# Patient Record
Sex: Female | Born: 1985 | Race: Black or African American | Hispanic: No | Marital: Married | Smoking: Never smoker
Health system: Southern US, Community
[De-identification: ages and names within clinical notes are randomized; demographics above are authoritative.]

## PROBLEM LIST (undated history)

## (undated) ENCOUNTER — Inpatient Hospital Stay (HOSPITAL_COMMUNITY): Payer: Self-pay

## (undated) DIAGNOSIS — Z789 Other specified health status: Secondary | ICD-10-CM

## (undated) DIAGNOSIS — O30009 Twin pregnancy, unspecified number of placenta and unspecified number of amniotic sacs, unspecified trimester: Secondary | ICD-10-CM

## (undated) DIAGNOSIS — D649 Anemia, unspecified: Secondary | ICD-10-CM

## (undated) DIAGNOSIS — O133 Gestational [pregnancy-induced] hypertension without significant proteinuria, third trimester: Secondary | ICD-10-CM

## (undated) HISTORY — PX: NO PAST SURGERIES: SHX2092

---

## 2010-01-12 ENCOUNTER — Emergency Department (HOSPITAL_COMMUNITY): Admission: EM | Admit: 2010-01-12 | Discharge: 2010-01-12 | Payer: Self-pay | Admitting: Emergency Medicine

## 2010-02-27 ENCOUNTER — Emergency Department (HOSPITAL_COMMUNITY): Admission: EM | Admit: 2010-02-27 | Discharge: 2010-02-27 | Payer: Self-pay | Admitting: Emergency Medicine

## 2010-03-06 ENCOUNTER — Ambulatory Visit (HOSPITAL_COMMUNITY): Admission: AD | Admit: 2010-03-06 | Discharge: 2010-03-06 | Payer: Self-pay | Admitting: Obstetrics and Gynecology

## 2010-03-06 ENCOUNTER — Ambulatory Visit: Payer: Self-pay | Admitting: Diagnostic Radiology

## 2010-03-06 ENCOUNTER — Emergency Department (HOSPITAL_BASED_OUTPATIENT_CLINIC_OR_DEPARTMENT_OTHER): Admission: EM | Admit: 2010-03-06 | Discharge: 2010-03-06 | Payer: Self-pay | Admitting: Emergency Medicine

## 2010-03-06 ENCOUNTER — Ambulatory Visit: Payer: Self-pay | Admitting: Obstetrics & Gynecology

## 2010-09-26 LAB — DIFFERENTIAL
Basophils Absolute: 0 10*3/uL (ref 0.0–0.1)
Basophils Relative: 1 % (ref 0–1)
Eosinophils Absolute: 0.1 10*3/uL (ref 0.0–0.7)
Eosinophils Relative: 1 % (ref 0–5)
Lymphocytes Relative: 36 % (ref 12–46)
Lymphs Abs: 1.5 10*3/uL (ref 0.7–4.0)
Lymphs Abs: 2.2 10*3/uL (ref 0.7–4.0)
Monocytes Absolute: 0.3 10*3/uL (ref 0.1–1.0)
Monocytes Absolute: 0.3 10*3/uL (ref 0.1–1.0)
Monocytes Relative: 7 % (ref 3–12)
Neutro Abs: 2.3 10*3/uL (ref 1.7–7.7)
Neutro Abs: 1.6 10*3/uL — ABNORMAL LOW (ref 1.7–7.7)

## 2010-09-26 LAB — CBC
Hemoglobin: 11.6 g/dL — ABNORMAL LOW (ref 12.0–15.0)
MCV: 95.6 fL (ref 78.0–100.0)
MCV: 97.9 fL (ref 78.0–100.0)
RBC: 3.65 MIL/uL — ABNORMAL LOW (ref 3.87–5.11)
WBC: 4.1 10*3/uL (ref 4.0–10.5)

## 2010-09-26 LAB — POCT I-STAT, CHEM 8
BUN: 8 mg/dL (ref 6–23)
Creatinine, Ser: 0.7 mg/dL (ref 0.4–1.2)
HCT: 39 % (ref 36.0–46.0)
Hemoglobin: 13.3 g/dL (ref 12.0–15.0)
Sodium: 137 mEq/L (ref 135–145)
TCO2: 22 mmol/L (ref 0–100)

## 2010-09-26 LAB — BASIC METABOLIC PANEL
BUN: 8 mg/dL (ref 6–23)
CO2: 25 mEq/L (ref 19–32)
Calcium: 9.1 mg/dL (ref 8.4–10.5)
Creatinine, Ser: 0.8 mg/dL (ref 0.4–1.2)
Glucose, Bld: 93 mg/dL (ref 70–99)
Potassium: 3.7 mEq/L (ref 3.5–5.1)
Sodium: 139 mEq/L (ref 135–145)

## 2010-09-26 LAB — URINALYSIS, ROUTINE W REFLEX MICROSCOPIC
Bilirubin Urine: NEGATIVE
Hgb urine dipstick: NEGATIVE
Ketones, ur: NEGATIVE mg/dL
Protein, ur: NEGATIVE mg/dL
Specific Gravity, Urine: 1.031 — ABNORMAL HIGH (ref 1.005–1.030)
Specific Gravity, Urine: 1.027 (ref 1.005–1.030)
pH: 6 (ref 5.0–8.0)
pH: 6 (ref 5.0–8.0)

## 2010-09-26 LAB — WET PREP, GENITAL
Trich, Wet Prep: NONE SEEN
Trich, Wet Prep: NONE SEEN
Yeast Wet Prep HPF POC: NONE SEEN
Yeast Wet Prep HPF POC: NONE SEEN

## 2010-09-26 LAB — HCG, QUANTITATIVE, PREGNANCY: hCG, Beta Chain, Quant, S: 76924 m[IU]/mL — ABNORMAL HIGH (ref <5)

## 2010-09-26 LAB — RPR: RPR Ser Ql: NONREACTIVE

## 2010-09-26 LAB — GC/CHLAMYDIA PROBE AMP, GENITAL
Chlamydia, DNA Probe: NEGATIVE
GC Probe Amp, Genital: NEGATIVE

## 2010-09-26 LAB — URINE MICROSCOPIC-ADD ON

## 2010-10-02 ENCOUNTER — Emergency Department (HOSPITAL_COMMUNITY)
Admission: EM | Admit: 2010-10-02 | Discharge: 2010-10-02 | Disposition: A | Discharge status: Home / Self Care | Payer: Self-pay | Attending: Emergency Medicine | Admitting: Emergency Medicine

## 2010-10-02 DIAGNOSIS — R509 Fever, unspecified: Secondary | ICD-10-CM | POA: Insufficient documentation

## 2010-10-02 DIAGNOSIS — R112 Nausea with vomiting, unspecified: Secondary | ICD-10-CM | POA: Insufficient documentation

## 2010-10-02 DIAGNOSIS — N751 Abscess of Bartholin's gland: Secondary | ICD-10-CM | POA: Insufficient documentation

## 2010-10-02 LAB — URINALYSIS, ROUTINE W REFLEX MICROSCOPIC
Glucose, UA: NEGATIVE mg/dL
Protein, ur: NEGATIVE mg/dL
Specific Gravity, Urine: 1.018 (ref 1.005–1.030)

## 2010-12-12 ENCOUNTER — Inpatient Hospital Stay (HOSPITAL_COMMUNITY)
Admission: AD | Admit: 2010-12-12 | Discharge: 2010-12-13 | Disposition: A | Discharge status: Home / Self Care | Payer: Self-pay | Source: Ambulatory Visit | Attending: Obstetrics & Gynecology | Admitting: Obstetrics & Gynecology

## 2010-12-12 DIAGNOSIS — O200 Threatened abortion: Secondary | ICD-10-CM | POA: Insufficient documentation

## 2010-12-12 LAB — URINALYSIS, ROUTINE W REFLEX MICROSCOPIC
Bilirubin Urine: NEGATIVE
Glucose, UA: NEGATIVE mg/dL
Hgb urine dipstick: NEGATIVE
Ketones, ur: NEGATIVE mg/dL
Protein, ur: NEGATIVE mg/dL
Urobilinogen, UA: 1 mg/dL (ref 0.0–1.0)

## 2010-12-12 LAB — WET PREP, GENITAL: Yeast Wet Prep HPF POC: NONE SEEN

## 2010-12-13 ENCOUNTER — Inpatient Hospital Stay (HOSPITAL_COMMUNITY): Payer: Self-pay

## 2010-12-13 LAB — ABO/RH: ABO/RH(D): O POS

## 2010-12-13 LAB — GC/CHLAMYDIA PROBE AMP, GENITAL: Chlamydia, DNA Probe: NEGATIVE

## 2010-12-13 LAB — HCG, QUANTITATIVE, PREGNANCY: hCG, Beta Chain, Quant, S: 222 m[IU]/mL — ABNORMAL HIGH (ref <5)

## 2010-12-14 ENCOUNTER — Inpatient Hospital Stay (HOSPITAL_COMMUNITY)
Admission: AD | Admit: 2010-12-14 | Discharge: 2010-12-15 | Disposition: A | Discharge status: Home / Self Care | Payer: Self-pay | Source: Ambulatory Visit | Attending: Obstetrics and Gynecology | Admitting: Obstetrics and Gynecology

## 2010-12-14 DIAGNOSIS — O209 Hemorrhage in early pregnancy, unspecified: Secondary | ICD-10-CM | POA: Insufficient documentation

## 2010-12-15 ENCOUNTER — Other Ambulatory Visit: Payer: Self-pay | Admitting: Obstetrics and Gynecology

## 2010-12-15 DIAGNOSIS — O209 Hemorrhage in early pregnancy, unspecified: Secondary | ICD-10-CM

## 2010-12-15 LAB — HCG, QUANTITATIVE, PREGNANCY: hCG, Beta Chain, Quant, S: 674 m[IU]/mL — ABNORMAL HIGH (ref <5)

## 2010-12-19 ENCOUNTER — Inpatient Hospital Stay (HOSPITAL_COMMUNITY)
Admission: AD | Admit: 2010-12-19 | Discharge: 2010-12-19 | Disposition: A | Discharge status: Home / Self Care | Payer: Self-pay | Source: Ambulatory Visit | Attending: Obstetrics & Gynecology | Admitting: Obstetrics & Gynecology

## 2010-12-19 ENCOUNTER — Ambulatory Visit (HOSPITAL_COMMUNITY)
Admission: RE | Admit: 2010-12-19 | Discharge: 2010-12-19 | Disposition: A | Discharge status: Home / Self Care | Payer: Self-pay | Source: Ambulatory Visit | Attending: Obstetrics and Gynecology | Admitting: Obstetrics and Gynecology

## 2010-12-19 DIAGNOSIS — O210 Mild hyperemesis gravidarum: Secondary | ICD-10-CM | POA: Insufficient documentation

## 2010-12-19 DIAGNOSIS — Z3689 Encounter for other specified antenatal screening: Secondary | ICD-10-CM | POA: Insufficient documentation

## 2010-12-19 DIAGNOSIS — O368390 Maternal care for abnormalities of the fetal heart rate or rhythm, unspecified trimester, not applicable or unspecified: Secondary | ICD-10-CM | POA: Insufficient documentation

## 2010-12-19 DIAGNOSIS — O209 Hemorrhage in early pregnancy, unspecified: Secondary | ICD-10-CM

## 2011-08-17 ENCOUNTER — Encounter (HOSPITAL_COMMUNITY): Payer: Self-pay | Admitting: *Deleted

## 2011-08-17 ENCOUNTER — Inpatient Hospital Stay (HOSPITAL_COMMUNITY)
Admission: AD | Admit: 2011-08-17 | Discharge: 2011-08-17 | Disposition: A | Discharge status: Home / Self Care | Payer: Self-pay | Source: Ambulatory Visit | Attending: Obstetrics & Gynecology | Admitting: Obstetrics & Gynecology

## 2011-08-17 DIAGNOSIS — O219 Vomiting of pregnancy, unspecified: Secondary | ICD-10-CM

## 2011-08-17 DIAGNOSIS — O210 Mild hyperemesis gravidarum: Secondary | ICD-10-CM | POA: Insufficient documentation

## 2011-08-17 LAB — CBC
HCT: 32.3 % — ABNORMAL LOW (ref 36.0–46.0)
MCH: 31.3 pg (ref 26.0–34.0)
MCV: 94.4 fL (ref 78.0–100.0)
RBC: 3.42 MIL/uL — ABNORMAL LOW (ref 3.87–5.11)
RDW: 12.6 % (ref 11.5–15.5)
WBC: 4.9 10*3/uL (ref 4.0–10.5)

## 2011-08-17 LAB — URINALYSIS, ROUTINE W REFLEX MICROSCOPIC
Bilirubin Urine: NEGATIVE
Hgb urine dipstick: NEGATIVE
Ketones, ur: NEGATIVE mg/dL
Specific Gravity, Urine: 1.02 (ref 1.005–1.030)
Urobilinogen, UA: 1 mg/dL (ref 0.0–1.0)

## 2011-08-17 LAB — COMPREHENSIVE METABOLIC PANEL
BUN: 10 mg/dL (ref 6–23)
CO2: 25 mEq/L (ref 19–32)
Calcium: 9.2 mg/dL (ref 8.4–10.5)
Chloride: 102 mEq/L (ref 96–112)
Creatinine, Ser: 0.69 mg/dL (ref 0.50–1.10)
GFR calc Af Amer: >90 mL/min (ref >90)
GFR calc non Af Amer: >90 mL/min (ref >90)
Total Bilirubin: 0.3 mg/dL (ref 0.3–1.2)

## 2011-08-17 MED ORDER — PROMETHAZINE HCL 25 MG/ML IJ SOLN
25.0000 mg | Freq: Once | INTRAMUSCULAR | Status: AC
  Administered 2011-08-17: 25 mg via INTRAMUSCULAR
  Filled 2011-08-17: qty 1

## 2011-08-17 MED ORDER — PROMETHAZINE HCL 12.5 MG PO TABS: 12.5000 mg | ORAL_TABLET | Freq: Four times a day (QID) | ORAL | Status: DC | PRN

## 2011-08-17 MED ORDER — PRENATAL RX 60-1 MG PO TABS: 1.0000 | ORAL_TABLET | Freq: Every day | ORAL | Status: DC

## 2011-08-17 NOTE — ED Provider Notes (Signed)
History   Pt presents today c/o severe N&V. She states she has gotten to the point she can no longer keep anything on her stomach. She also c/o fever that happens only at night. She denies SOB, CP, cough, vag bleeding, abd pain, or any other sx at this time.  Chief Complaint  Patient presents with  . Possible Pregnancy  . Abdominal Pain  . Nausea   HPI  OB History    Grav Para Term Preterm Abortions TAB SAB Ect Mult Living   2 0 0 0 1 0 1 0 0 0       History reviewed. No pertinent past medical history.  History reviewed. No pertinent past surgical history.  History reviewed. No pertinent family history.  History  Substance Use Topics  . Smoking status: Never Smoker   . Smokeless tobacco: Never Used  . Alcohol Use: No    Allergies:  Allergies  Allergen Reactions  . Latex     No prescriptions prior to admission    Review of Systems  Constitutional: Negative for fever and chills.  Eyes: Negative for blurred vision and double vision.  Respiratory: Negative for cough, hemoptysis, sputum production, shortness of breath and wheezing.   Cardiovascular: Negative for chest pain and palpitations.  Gastrointestinal: Positive for nausea and vomiting. Negative for abdominal pain, diarrhea and constipation.  Genitourinary: Negative for dysuria, urgency, frequency and hematuria.  Neurological: Negative for dizziness and headaches.  Psychiatric/Behavioral: Negative for depression and suicidal ideas.   Physical Exam   Blood pressure 121/65, pulse 76, temperature 99.3 F (37.4 C), temperature source Oral, resp. rate 16, height 5' 5.5" (1.664 m), weight 219 lb (99.338 kg), last menstrual period 07/18/2011, SpO2 99.00%.  Physical Exam  Nursing note and vitals reviewed. Constitutional: She is oriented to person, place, and time. She appears well-developed and well-nourished. No distress.  HENT:  Head: Normocephalic and atraumatic.  Eyes: EOM are normal. Pupils are equal, round,  and reactive to light.  GI: Soft. She exhibits no distension and no mass. There is no tenderness. There is no rebound and no guarding.  Neurological: She is alert and oriented to person, place, and time.  Skin: Skin is warm and dry. She is not diaphoretic.  Psychiatric: She has a normal mood and affect. Her behavior is normal. Judgment and thought content normal.    MAU Course  Procedures    Assessment and Plan  Care of pt turned over to D. Dedrick Heffner, CNM at 7:35pm.  Clinton Gallant. Rice III, DrHSc, MPAS, PA-C  08/17/2011, 7:02 PM   Henrietta Hoover, PA 08/17/11 1935  Results for orders placed during the hospital encounter of 08/17/11 (from the past 24 hour(s))  URINALYSIS, ROUTINE W REFLEX MICROSCOPIC     Status: Normal   Collection Time   08/17/11  6:25 PM      Component Value Range   Color, Urine YELLOW  YELLOW    APPearance CLEAR  CLEAR    Specific Gravity, Urine 1.020  1.005 - 1.030    pH 6.5  5.0 - 8.0    Glucose, UA NEGATIVE  NEGATIVE (mg/dL)   Hgb urine dipstick NEGATIVE  NEGATIVE    Bilirubin Urine NEGATIVE  NEGATIVE    Ketones, ur NEGATIVE  NEGATIVE (mg/dL)   Protein, ur NEGATIVE  NEGATIVE (mg/dL)   Urobilinogen, UA 1.0  0.0 - 1.0 (mg/dL)   Nitrite NEGATIVE  NEGATIVE    Leukocytes, UA NEGATIVE  NEGATIVE   POCT PREGNANCY, URINE     Status:  Abnormal   Collection Time   08/17/11  6:40 PM      Component Value Range   Preg Test, Ur POSITIVE (*) NEGATIVE   CBC     Status: Abnormal   Collection Time   08/17/11  7:10 PM      Component Value Range   WBC 4.9  4.0 - 10.5 (K/uL)   RBC 3.42 (*) 3.87 - 5.11 (MIL/uL)   Hemoglobin 10.7 (*) 12.0 - 15.0 (g/dL)   HCT 14.7 (*) 82.9 - 46.0 (%)   MCV 94.4  78.0 - 100.0 (fL)   MCH 31.3  26.0 - 34.0 (pg)   MCHC 33.1  30.0 - 36.0 (g/dL)   RDW 56.2  13.0 - 86.5 (%)   Platelets 208  150 - 400 (K/uL)  COMPREHENSIVE METABOLIC PANEL     Status: Abnormal   Collection Time   08/17/11  7:10 PM      Component Value Range   Sodium 135  135 - 145 (mEq/L)    Potassium 3.5  3.5 - 5.1 (mEq/L)   Chloride 102  96 - 112 (mEq/L)   CO2 25  19 - 32 (mEq/L)   Glucose, Bld 92  70 - 99 (mg/dL)   BUN 10  6 - 23 (mg/dL)   Creatinine, Ser 7.84  0.50 - 1.10 (mg/dL)   Calcium 9.2  8.4 - 69.6 (mg/dL)   Total Protein 7.1  6.0 - 8.3 (g/dL)   Albumin 3.4 (*) 3.5 - 5.2 (g/dL)   AST 12  0 - 37 (U/L)   ALT 6  0 - 35 (U/L)   Alkaline Phosphatase 68  39 - 117 (U/L)   Total Bilirubin 0.3  0.3 - 1.2 (mg/dL)   GFR calc non Af Amer >90  >90 (mL/min)   GFR calc Af Amer >90  >90 (mL/min)     MAU Course: Felt better after antiemetic and retaining Sprite  A/P: Early pregnancy with N/V of pregnancy Rx Phenergan, PNVs

## 2011-08-17 NOTE — Progress Notes (Signed)
States she has a fever every night up to 103 but goes down during the night and is OK during the day.

## 2011-08-17 NOTE — Progress Notes (Signed)
Patient states she has had 2 positive home pregnancy tests. Has been having lower abdominal pain, dizziness, nausea and vomiting for a couple of weeks. Vomiting is getting worse and now unable to keep anything down.

## 2011-08-21 ENCOUNTER — Encounter (HOSPITAL_COMMUNITY): Payer: Self-pay | Admitting: *Deleted

## 2011-08-21 ENCOUNTER — Inpatient Hospital Stay (HOSPITAL_COMMUNITY)
Admission: AD | Admit: 2011-08-21 | Discharge: 2011-08-22 | Disposition: A | Discharge status: Home / Self Care | Payer: Self-pay | Source: Ambulatory Visit | Attending: Obstetrics and Gynecology | Admitting: Obstetrics and Gynecology

## 2011-08-21 DIAGNOSIS — O210 Mild hyperemesis gravidarum: Secondary | ICD-10-CM | POA: Insufficient documentation

## 2011-08-21 DIAGNOSIS — Z3490 Encounter for supervision of normal pregnancy, unspecified, unspecified trimester: Secondary | ICD-10-CM

## 2011-08-21 DIAGNOSIS — Z331 Pregnant state, incidental: Secondary | ICD-10-CM

## 2011-08-21 DIAGNOSIS — O219 Vomiting of pregnancy, unspecified: Secondary | ICD-10-CM

## 2011-08-21 HISTORY — DX: Other specified health status: Z78.9

## 2011-08-21 LAB — URINALYSIS, ROUTINE W REFLEX MICROSCOPIC
Leukocytes, UA: NEGATIVE
Nitrite: NEGATIVE
Protein, ur: NEGATIVE mg/dL
Urobilinogen, UA: 0.2 mg/dL (ref 0.0–1.0)

## 2011-08-21 MED ORDER — ONDANSETRON 8 MG PO TBDP
8.0000 mg | ORAL_TABLET | Freq: Once | ORAL | Status: AC
  Administered 2011-08-21: 8 mg via ORAL
  Filled 2011-08-21: qty 1

## 2011-08-21 MED ORDER — GLYCOPYRROLATE 1 MG PO TABS
1.0000 mg | ORAL_TABLET | Freq: Once | ORAL | Status: DC
  Filled 2011-08-21: qty 1

## 2011-08-21 NOTE — ED Notes (Signed)
Marie Williams CNM at bedside 

## 2011-08-21 NOTE — Progress Notes (Signed)
Pt states, " I've had cramping in my lower abdomen for 2-3 days. It started after left here a few days ago. I am taking the phenergan but it doesn't stay down. I am throwing up everything I eat or drink, and I have to spit constantly."

## 2011-08-22 ENCOUNTER — Inpatient Hospital Stay (HOSPITAL_COMMUNITY): Payer: Self-pay

## 2011-08-22 LAB — WET PREP, GENITAL: Yeast Wet Prep HPF POC: NONE SEEN

## 2011-08-22 LAB — HCG, QUANTITATIVE, PREGNANCY: hCG, Beta Chain, Quant, S: 53510 m[IU]/mL — ABNORMAL HIGH (ref <5)

## 2011-08-22 LAB — GC/CHLAMYDIA PROBE AMP, GENITAL
Chlamydia, DNA Probe: NEGATIVE
GC Probe Amp, Genital: NEGATIVE

## 2011-08-22 MED ORDER — PROMETHAZINE HCL 25 MG/ML IJ SOLN
25.0000 mg | Freq: Once | INTRAMUSCULAR | Status: AC
  Administered 2011-08-22: 25 mg via INTRAMUSCULAR

## 2011-08-22 MED ORDER — PROMETHAZINE HCL 25 MG RE SUPP: 25.0000 mg | Freq: Four times a day (QID) | RECTAL | Status: DC | PRN

## 2011-08-22 MED ORDER — PROMETHAZINE HCL 25 MG/ML IJ SOLN
25.0000 mg | Freq: Once | INTRAMUSCULAR | Status: DC
  Filled 2011-08-22: qty 1

## 2011-08-22 NOTE — Progress Notes (Signed)
Wynelle Bourgeois CNM in to see pt. Spec exam done and wet prep and GC/Chlam obtained and pt tol well.

## 2011-08-22 NOTE — ED Provider Notes (Signed)
Agree with above note.  Susan Zhang 08/22/2011 6:12 AM

## 2011-08-22 NOTE — Progress Notes (Signed)
Written and verbal d/c instructions given and understanding voiced. 

## 2011-08-22 NOTE — ED Provider Notes (Signed)
History     Chief Complaint  Patient presents with  . Morning Sickness  . Abdominal Pain   HPI This is a 26 y.o. female at [redacted]w[redacted]d who presents with c/o pelvic cramping and persistent nausea and vomiting. States cannot keep any food or fluids down. States spits a lot. No bleeding. Has been seen here recently for the same.   OB History    Grav Para Term Preterm Abortions TAB SAB Ect Mult Living   3 0 0 0 1 0 1 0 0 0       Past Medical History  Diagnosis Date  . No pertinent past medical history     Past Surgical History  Procedure Date  . Induced abortion 2010  . Dilation and curretage     Family History  Problem Relation Age of Onset  . Anesthesia problems Neg Hx     History  Substance Use Topics  . Smoking status: Never Smoker   . Smokeless tobacco: Never Used  . Alcohol Use: No    Allergies:  Allergies  Allergen Reactions  . Latex Rash    Prescriptions prior to admission  Medication Sig Dispense Refill  . Prenatal Vit-Fe Fumarate-FA (PRENATAL MULTIVITAMIN) 60-1 MG tablet Take 1 tablet by mouth daily.  30 tablet  4  . promethazine (PHENERGAN) 12.5 MG tablet Take 1 tablet (12.5 mg total) by mouth every 6 (six) hours as needed for nausea.  30 tablet  0  . Cyanocobalamin (VITAMIN B-12 IJ) Inject 1 mL as directed every 7 (seven) days.         ROS As above Physical Exam   Blood pressure 109/67, pulse 72, temperature 99.4 F (37.4 C), temperature source Oral, resp. rate 18, height 5\' 6"  (1.676 m), weight 214 lb 8 oz (97.297 kg), last menstrual period 07/18/2011.  Physical Exam  Constitutional: She is oriented to person, place, and time. She appears well-developed and well-nourished. No distress.  HENT:  Head: Normocephalic.  Cardiovascular: Normal rate.   Respiratory: Effort normal.  GI: Soft. She exhibits no distension and no mass. There is no tenderness. There is no rebound and no guarding.  Genitourinary: Vagina normal and uterus normal. No vaginal  discharge found.       Cultures done, cervix closed. Uterus 6 wk size, slightly tender   Musculoskeletal: Normal range of motion.  Neurological: She is alert and oriented to person, place, and time.  Skin: Skin is warm and dry.  Psychiatric: She has a normal mood and affect.   Verbal report on US showed IUP Results for orders placed during the hospital encounter of 08/21/11 (from the past 24 hour(s))  URINALYSIS, ROUTINE W REFLEX MICROSCOPIC     Status: Abnormal   Collection Time   08/21/11 11:15 PM      Component Value Range   Color, Urine YELLOW  YELLOW    APPearance CLEAR  CLEAR    Specific Gravity, Urine <1.005 (*) 1.005 - 1.030    pH 6.0  5.0 - 8.0    Glucose, UA NEGATIVE  NEGATIVE (mg/dL)   Hgb urine dipstick NEGATIVE  NEGATIVE    Bilirubin Urine NEGATIVE  NEGATIVE    Ketones, ur NEGATIVE  NEGATIVE (mg/dL)   Protein, ur NEGATIVE  NEGATIVE (mg/dL)   Urobilinogen, UA 0.2  0.0 - 1.0 (mg/dL)   Nitrite NEGATIVE  NEGATIVE    Leukocytes, UA NEGATIVE  NEGATIVE   HCG, QUANTITATIVE, PREGNANCY     Status: Abnormal   Collection Time   08/21/11 11:27  PM      Component Value Range   hCG, Beta Chain, Quant, S 53510 (*) <5 (mIU/mL)     MAU Course  Procedures   Assessment and Plan  A:  SIUP at 6 week size       Nausea and vomiting, with no evidence of dehydration  P:  Has appt with Nestor Ramp on the 15th       Wants shot of Phenergan here       Will try Phenergan suppositories (states zofran did not work and pills come back up)       F/U at office as scheduled       [facility] 08/22/2011, 12:45 AM

## 2011-08-30 ENCOUNTER — Emergency Department (HOSPITAL_BASED_OUTPATIENT_CLINIC_OR_DEPARTMENT_OTHER)
Admission: EM | Admit: 2011-08-30 | Discharge: 2011-08-30 | Disposition: A | Discharge status: Home / Self Care | Payer: Self-pay | Attending: Emergency Medicine | Admitting: Emergency Medicine

## 2011-08-30 ENCOUNTER — Encounter (HOSPITAL_BASED_OUTPATIENT_CLINIC_OR_DEPARTMENT_OTHER): Payer: Self-pay | Admitting: Emergency Medicine

## 2011-08-30 DIAGNOSIS — O210 Mild hyperemesis gravidarum: Secondary | ICD-10-CM | POA: Insufficient documentation

## 2011-08-30 DIAGNOSIS — O219 Vomiting of pregnancy, unspecified: Secondary | ICD-10-CM

## 2011-08-30 LAB — URINALYSIS, ROUTINE W REFLEX MICROSCOPIC
Bilirubin Urine: NEGATIVE
Glucose, UA: NEGATIVE mg/dL
Hgb urine dipstick: NEGATIVE
Ketones, ur: 15 mg/dL — AB
Leukocytes, UA: NEGATIVE
pH: 6 (ref 5.0–8.0)

## 2011-08-30 MED ORDER — PROMETHAZINE HCL 25 MG/ML IJ SOLN
25.0000 mg | Freq: Once | INTRAMUSCULAR | Status: AC
  Administered 2011-08-30: 25 mg via INTRAMUSCULAR
  Filled 2011-08-30: qty 1

## 2011-08-30 MED ORDER — PROMETHAZINE HCL 25 MG RE SUPP: 25.0000 mg | Freq: Four times a day (QID) | RECTAL | Status: DC | PRN

## 2011-08-30 NOTE — Discharge Instructions (Signed)
Morning Sickness Morning sickness is when you feel sick to your stomach (nauseous) during pregnancy. This nauseous feeling may or may not come with throwing up (vomiting). It often occurs in the morning, but can be a problem any time of day. While morning sickness is unpleasant, it is usually harmless unless you develop severe and continual vomiting (hyperemesis gravidarum). This condition requires more intense treatment. CAUSES  The cause of morning sickness is not completely known but seems to be related to a sudden increase of two hormones:   Human chorionic gonadotropin (hCG).   Estrogen hormone.  These are elevated in the first part of the pregnancy. TREATMENT  Do not use any medicines (prescription, over-the-counter, or herbal) for morning sickness without first talking to your caregiver. Some patients are helped by the following:  Vitamin B6 (25mg  every 8 hours) or vitamin B6 shots.   An antihistamine called doxylamine (10mg  every 8 hours).   The herbal medication ginger.  HOME CARE INSTRUCTIONS   Taking multivitamins before getting pregnant can prevent or decrease the severity of morning sickness in most women.   Eat a piece of dry toast or unsalted crackers before getting out of bed in the morning.   Eat 5 or 6 small meals a day.   Eat dry and bland foods (rice, baked potato).   Do not drink liquids with your meals. Drink liquids between meals.   Avoid greasy, fatty, and spicy foods.   Get someone to cook for you if the smell of any food causes nausea and vomiting.   Avoid vitamin pills with iron because iron can cause nausea.   Snack on protein foods between meals if you are hungry.   Eat unsweetened gelatins for deserts.   Wear an acupressure wristband (worn for sea sickness) may be helpful.   Acupuncture may be helpful.   Do not smoke.   Get a humidifier to keep the air in your house free of odors.  SEEK MEDICAL CARE IF:   Your home remedies are not working  and you need medication.   You feel dizzy or lightheaded.   You are losing weight.   You need help with your diet.  SEEK IMMEDIATE MEDICAL CARE IF:   You have persistent and uncontrolled nausea and vomiting.   You pass out (faint).   You have a fever.  MAKE SURE YOU:   Understand these instructions.   Will watch your condition.   Will get help right away if you are not doing well or get worse.  Document Released: 08/20/2006 Document Revised: 03/11/2011 Document Reviewed: 06/17/2007 [facility] Patient Information 2012 [place], [place].

## 2011-08-30 NOTE — ED Provider Notes (Signed)
History     CSN: [id number]  Arrival date & time 08/30/11  2014   First MD Initiated Contact with Patient 08/30/11 2133      Chief Complaint  Patient presents with  . Emesis During Pregnancy    (Consider location/radiation/quality/duration/timing/severity/associated sxs/prior treatment) Patient is a 26 y.o. female presenting with vomiting. The history is provided by the patient. A language interpreter was used.  Emesis  This is a recurrent problem. The current episode started more than 1 week ago. The problem occurs 5 to 10 times per day. The problem has not changed since onset.The emesis has an appearance of stomach contents. There has been no fever. The fever has been present for less than 1 day. Pertinent negatives include no abdominal pain, no chills and no fever.   she is early pregnant she has vomiting after everything she eats. Patient has been taking Phenergan tablets at home with no relief. She has also taken Zofran at home without relief. Patient has been seen at maternity admissions to times in the last week. Patient had an ultrasound which showed a single intrauterine pregnancy with good fetal heart tones. Patient was given Phenergan injection and advised to establish prenatal care. Patient came to the emergency department tonight because she [place] [facility] to here than women[facility]. Patient is concerned that there is something wrong with her that is causing her to have continued vomiting.  Past Medical History  Diagnosis Date  . No pertinent past medical history   . Miscarriage     Past Surgical History  Procedure Date  . Induced abortion 2010  . Dilation and curretage     Family History  Problem Relation Age of Onset  . Anesthesia problems Neg Hx     History  Substance Use Topics  . Smoking status: Never Smoker   . Smokeless tobacco: Never Used  . Alcohol Use: No    OB History    Grav Para Term Preterm Abortions TAB SAB Ect Mult Living   3 0 0 0 1 0 1 0 0 0        Review of Systems  Constitutional: Negative for fever and chills.  Gastrointestinal: Positive for nausea and vomiting. Negative for abdominal pain.  All other systems reviewed and are negative.    Allergies  Catfish and Latex  Home Medications   Current Outpatient Rx  Name Route Sig Dispense Refill  . PRENATAL RX 60-1 MG PO TABS Oral Take 1 tablet by mouth daily. 30 tablet 4  . PROMETHAZINE HCL 25 MG RE SUPP Rectal Place 1 suppository (25 mg total) rectally every 6 (six) hours as needed for nausea. 12 each 0    BP 120/55  Pulse 76  Temp(Src) 99.2 F (37.3 C) (Oral)  Resp 20  Ht 5\' 7"  (1.702 m)  Wt 204 lb (92.534 kg)  BMI 31.95 kg/m2  SpO2 100%  LMP 07/18/2011  Physical Exam  Nursing note and vitals reviewed. Constitutional: She is oriented to person, place, and time. She appears well-developed and well-nourished.  HENT:  Head: Normocephalic and atraumatic.  Right Ear: External ear normal.  Left Ear: External ear normal.  Nose: Nose normal.  Mouth/Throat: Oropharynx is clear and moist.  Eyes: Conjunctivae and EOM are normal. Pupils are equal, round, and reactive to light.  Neck: Normal range of motion. Neck supple.  Cardiovascular: Normal rate and normal heart sounds.   Pulmonary/Chest: Effort normal and breath sounds normal.  Abdominal: Soft. Bowel sounds are normal.  Musculoskeletal: Normal range of  motion.  Neurological: She is alert and oriented to person, place, and time. She has normal reflexes.  Skin: Skin is warm.  Psychiatric: She has a normal mood and affect.    ED Course  Procedures (including critical care time)  Labs Reviewed  URINALYSIS, ROUTINE W REFLEX MICROSCOPIC - Abnormal; Notable for the following:    Ketones, ur 15 (*)    All other components within normal limits   No results found.   No diagnosis found.    MDM  UA .btained shows 15 ketones, otherwise negative patient is given an injection of Phenergan she is encouraged to  drink fluids. She is advised to go to [facility] for further evaluation if symptoms worsen or change  Patient is given prescription for Phenergan suppositories.      Langston Masker, [place] 08/30/11 2258

## 2011-08-30 NOTE — ED Notes (Signed)
Pt reports she found out that she was pregnant [redacted] weeks ago- states she vomits "everything she eats" and today has been vomiting blood- also c/o lower abd pain and brown vaginal discharge- also c/o low back pain

## 2011-08-31 NOTE — ED Provider Notes (Signed)
Medical screening examination/treatment/procedure(s) were performed by non-physician practitioner and as supervising physician I was immediately available for consultation/collaboration.  Doug Sou, MD 08/31/11 [phone]

## 2012-03-30 ENCOUNTER — Emergency Department (HOSPITAL_BASED_OUTPATIENT_CLINIC_OR_DEPARTMENT_OTHER): Payer: No Typology Code available for payment source

## 2012-03-30 ENCOUNTER — Encounter (HOSPITAL_BASED_OUTPATIENT_CLINIC_OR_DEPARTMENT_OTHER): Payer: Self-pay

## 2012-03-30 ENCOUNTER — Emergency Department (HOSPITAL_BASED_OUTPATIENT_CLINIC_OR_DEPARTMENT_OTHER)
Admission: EM | Admit: 2012-03-30 | Discharge: 2012-03-30 | Disposition: A | Discharge status: Home / Self Care | Payer: No Typology Code available for payment source | Attending: Emergency Medicine | Admitting: Emergency Medicine

## 2012-03-30 DIAGNOSIS — S0003XA Contusion of scalp, initial encounter: Secondary | ICD-10-CM | POA: Insufficient documentation

## 2012-03-30 DIAGNOSIS — S0033XA Contusion of nose, initial encounter: Secondary | ICD-10-CM

## 2012-03-30 DIAGNOSIS — Y92410 Unspecified street and highway as the place of occurrence of the external cause: Secondary | ICD-10-CM | POA: Insufficient documentation

## 2012-03-30 HISTORY — DX: Anemia, unspecified: D64.9

## 2012-03-30 MED ORDER — IBUPROFEN 800 MG PO TABS: 800.0000 mg | ORAL_TABLET | Freq: Three times a day (TID) | ORAL | Status: DC
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
## 2012-03-30 NOTE — ED Provider Notes (Signed)
Medical screening examination/treatment/procedure(s) were performed by non-physician practitioner and as supervising physician I was immediately available for consultation/collaboration.  Jodiann Ognibene, MD 03/30/12 2318 
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
## 2012-04-28 ENCOUNTER — Ambulatory Visit
Admission: RE | Admit: 2012-04-28 | Discharge: 2012-04-28 | Disposition: A | Discharge status: Home / Self Care | Payer: Self-pay | Source: Ambulatory Visit | Attending: Chiropractor | Admitting: Chiropractor

## 2012-04-28 ENCOUNTER — Other Ambulatory Visit: Payer: Self-pay | Admitting: Chiropractic Medicine

## 2012-07-05 ENCOUNTER — Emergency Department (HOSPITAL_BASED_OUTPATIENT_CLINIC_OR_DEPARTMENT_OTHER)
Admission: EM | Admit: 2012-07-05 | Discharge: 2012-07-05 | Disposition: A | Discharge status: Home / Self Care | Payer: Self-pay | Attending: Emergency Medicine | Admitting: Emergency Medicine

## 2012-07-05 ENCOUNTER — Encounter (HOSPITAL_BASED_OUTPATIENT_CLINIC_OR_DEPARTMENT_OTHER): Payer: Self-pay | Admitting: Emergency Medicine

## 2012-07-05 ENCOUNTER — Emergency Department (HOSPITAL_BASED_OUTPATIENT_CLINIC_OR_DEPARTMENT_OTHER): Payer: Self-pay

## 2012-07-05 DIAGNOSIS — Z8742 Personal history of other diseases of the female genital tract: Secondary | ICD-10-CM | POA: Insufficient documentation

## 2012-07-05 DIAGNOSIS — R109 Unspecified abdominal pain: Secondary | ICD-10-CM | POA: Insufficient documentation

## 2012-07-05 DIAGNOSIS — K5900 Constipation, unspecified: Secondary | ICD-10-CM | POA: Insufficient documentation

## 2012-07-05 DIAGNOSIS — Z791 Long term (current) use of non-steroidal anti-inflammatories (NSAID): Secondary | ICD-10-CM | POA: Insufficient documentation

## 2012-07-05 DIAGNOSIS — Z862 Personal history of diseases of the blood and blood-forming organs and certain disorders involving the immune mechanism: Secondary | ICD-10-CM | POA: Insufficient documentation

## 2012-07-05 DIAGNOSIS — Z3202 Encounter for pregnancy test, result negative: Secondary | ICD-10-CM | POA: Insufficient documentation

## 2012-07-05 LAB — BASIC METABOLIC PANEL
CO2: 24 mEq/L (ref 19–32)
Chloride: 103 mEq/L (ref 96–112)
Creatinine, Ser: 0.7 mg/dL (ref 0.50–1.10)
Sodium: 138 mEq/L (ref 135–145)

## 2012-07-05 LAB — CBC
Hemoglobin: 11.7 g/dL — ABNORMAL LOW (ref 12.0–15.0)
MCV: 92.8 fL (ref 78.0–100.0)
Platelets: 187 10*3/uL (ref 150–400)
RBC: 3.76 MIL/uL — ABNORMAL LOW (ref 3.87–5.11)
WBC: 3.1 10*3/uL — ABNORMAL LOW (ref 4.0–10.5)

## 2012-07-05 LAB — URINALYSIS, ROUTINE W REFLEX MICROSCOPIC
Bilirubin Urine: NEGATIVE
Glucose, UA: NEGATIVE mg/dL
Nitrite: NEGATIVE
Specific Gravity, Urine: 1.016 (ref 1.005–1.030)
pH: 8 (ref 5.0–8.0)

## 2012-07-05 MED ORDER — KETOROLAC TROMETHAMINE 60 MG/2ML IM SOLN
60.0000 mg | Freq: Once | INTRAMUSCULAR | Status: AC
  Administered 2012-07-05: 60 mg via INTRAMUSCULAR
  Filled 2012-07-05: qty 2

## 2012-07-05 MED ORDER — POLYETHYLENE GLYCOL 3350 17 G PO PACK: 17.0000 g | PACK | Freq: Every day | ORAL | Status: DC

## 2012-07-05 MED ORDER — TRAMADOL HCL 50 MG PO TABS: 50.0000 mg | ORAL_TABLET | Freq: Four times a day (QID) | ORAL | Status: DC | PRN

## 2012-07-05 NOTE — ED Notes (Signed)
Pt does report history of constipation, last bm 2/23 without relief of abdominal pain

## 2012-07-05 NOTE — ED Provider Notes (Signed)
History     CSN: [id number]  Arrival date & time 07/05/12  0543   First MD Initiated Contact with Patient 07/05/12 0601      Chief Complaint  Patient presents with  . Abdominal Pain    (Consider location/radiation/quality/duration/timing/severity/associated sxs/prior treatment) Patient is a 26 y.o. female presenting with abdominal pain and constipation. The history is provided by the patient. No language interpreter was used.  Abdominal Pain The primary symptoms of the illness include abdominal pain. The primary symptoms of the illness do not include fever, shortness of breath, nausea, vomiting, diarrhea, dysuria, vaginal discharge or vaginal bleeding.  Additional symptoms associated with the illness include constipation. Symptoms associated with the illness do not include anorexia.  Constipation  The current episode started more than 1 week ago. The onset was gradual. The problem occurs continuously. The problem has been unchanged. The pain is severe. The stool is described as soft. There was no prior successful therapy. There was no prior unsuccessful therapy. Associated symptoms include abdominal pain. Pertinent negatives include no anorexia, no fever, no diarrhea, no nausea, no vomiting, no vaginal bleeding and no vaginal discharge. She has been behaving normally. She has been eating and drinking normally. Her past medical history does not include a recent illness. There were no sick contacts. She has received no recent medical care.  Abdominal pain lasting seconds to minutes.  Took MOM without relief.  Thinks it may be bad gas  Past Medical History  Diagnosis Date  . No pertinent past medical history   . Miscarriage   . Anemia     Past Surgical History  Procedure Date  . Induced abortion 2010  . Dilation and curretage     Family History  Problem Relation Age of Onset  . Anesthesia problems Neg Hx     History  Substance Use Topics  . Smoking status: Never Smoker   .  Smokeless tobacco: Never Used  . Alcohol Use: No    OB History    Grav Para Term Preterm Abortions TAB SAB Ect Mult Living   3 0 0 0 1 0 1 0 0 0       Review of Systems  Constitutional: Negative for fever.  Respiratory: Negative for shortness of breath.   Gastrointestinal: Positive for abdominal pain and constipation. Negative for nausea, vomiting, diarrhea and anorexia.  Genitourinary: Negative for dysuria, vaginal bleeding and vaginal discharge.  All other systems reviewed and are negative.    Allergies  Catfish and Latex  Home Medications   Current Outpatient Rx  Name  Route  Sig  Dispense  Refill  . IBUPROFEN 800 MG PO TABS   Oral   Take 1 tablet (800 mg total) by mouth 3 (three) times daily.   21 tablet   0     BP 126/76  Pulse 80  Temp 98.4 F (36.9 C) (Oral)  Resp 16  Ht 5\' 8"  (1.727 m)  Wt 210 lb (95.255 kg)  BMI 31.93 kg/m2  SpO2 100%  Physical Exam  Constitutional: She is oriented to person, place, and time. She appears well-developed and well-nourished. No distress.  HENT:  Head: Normocephalic and atraumatic.  Mouth/Throat: Oropharynx is clear and moist.  Eyes: Conjunctivae normal are normal. Pupils are equal, round, and reactive to light.  Neck: Normal range of motion.  Cardiovascular: Normal rate, regular rhythm and intact distal pulses.   Pulmonary/Chest: Effort normal and breath sounds normal. She has no wheezes. She has no rales.  Abdominal: Soft. Bowel  sounds are increased. There is no tenderness. There is no rebound and no guarding.  Musculoskeletal: Normal range of motion.  Neurological: She is alert and oriented to person, place, and time.  Skin: Skin is warm.  Psychiatric: She has a normal mood and affect.    ED Course  Procedures (including critical care time)  Labs Reviewed  URINALYSIS, ROUTINE W REFLEX MICROSCOPIC - Abnormal; Notable for the following:    APPearance CLOUDY (*)     All other components within normal limits   PREGNANCY, URINE  CBC  BASIC METABOLIC PANEL   No results found.   No diagnosis found.    MDM  Suspect gas pain.  Pain lasting seconds with reassuring exam and labs follow up with your family doctor for ongoing care.  Change to miralax.  Patient verbalizes understanding and agrees to follow up        Zyere Jiminez Smitty Cords, MD 07/05/12 [phone]

## 2012-07-05 NOTE — ED Notes (Signed)
Pt reports diffuse abdominal pain all over for 3 days, pt took mom without relief

## 2012-07-28 ENCOUNTER — Other Ambulatory Visit: Payer: Self-pay

## 2013-01-26 ENCOUNTER — Emergency Department (HOSPITAL_BASED_OUTPATIENT_CLINIC_OR_DEPARTMENT_OTHER)
Admission: EM | Admit: 2013-01-26 | Discharge: 2013-01-26 | Disposition: A | Discharge status: Home / Self Care | Payer: Self-pay | Attending: Emergency Medicine | Admitting: Emergency Medicine

## 2013-01-26 ENCOUNTER — Encounter (HOSPITAL_BASED_OUTPATIENT_CLINIC_OR_DEPARTMENT_OTHER): Payer: Self-pay | Admitting: *Deleted

## 2013-01-26 DIAGNOSIS — Z862 Personal history of diseases of the blood and blood-forming organs and certain disorders involving the immune mechanism: Secondary | ICD-10-CM | POA: Insufficient documentation

## 2013-01-26 DIAGNOSIS — Z91040 Latex allergy status: Secondary | ICD-10-CM | POA: Insufficient documentation

## 2013-01-26 DIAGNOSIS — Z3202 Encounter for pregnancy test, result negative: Secondary | ICD-10-CM | POA: Insufficient documentation

## 2013-01-26 DIAGNOSIS — M545 Low back pain, unspecified: Secondary | ICD-10-CM | POA: Insufficient documentation

## 2013-01-26 LAB — URINALYSIS, ROUTINE W REFLEX MICROSCOPIC
Glucose, UA: NEGATIVE mg/dL
Leukocytes, UA: NEGATIVE
pH: 6 (ref 5.0–8.0)

## 2013-01-26 LAB — PREGNANCY, URINE: Preg Test, Ur: NEGATIVE

## 2013-01-26 MED ORDER — NAPROXEN 250 MG PO TABS
500.0000 mg | ORAL_TABLET | Freq: Once | ORAL | Status: AC
  Administered 2013-01-26: 500 mg via ORAL
  Filled 2013-01-26: qty 2

## 2013-01-26 MED ORDER — FENTANYL CITRATE 0.05 MG/ML IJ SOLN
100.0000 ug | Freq: Once | INTRAMUSCULAR | Status: AC
  Administered 2013-01-26: 100 ug via INTRAMUSCULAR
  Filled 2013-01-26: qty 2

## 2013-01-26 MED ORDER — HYDROCODONE-ACETAMINOPHEN 5-325 MG PO TABS: 1.0000 | ORAL_TABLET | Freq: Four times a day (QID) | ORAL | Status: DC | PRN

## 2013-01-26 MED ORDER — NAPROXEN SODIUM 550 MG PO TABS: 550.0000 mg | ORAL_TABLET | Freq: Two times a day (BID) | ORAL | Status: DC

## 2013-01-26 NOTE — ED Notes (Signed)
Pt states she was dropped off at ED. Informed pt that when her ride was visible here in ED we could give her an injection of pain medication. Pt states ride is on the way. Pt told me she doesn't like to take pills and usually throws medications away at home instead of taking them. I informed pt she was receiving a prescription for Vicodin which is a narcotic, and if medication was just going to be thrown away I would not give her the prescription. Pt states she wants the prescription and will take the medication by mouth. Awaiting pt ride at this time.

## 2013-01-26 NOTE — ED Provider Notes (Signed)
History    CSN: [id number] Arrival date & time 01/26/13  0027  None    Chief Complaint  Patient presents with  . Back Pain   (Consider location/radiation/quality/duration/timing/severity/associated sxs/prior Treatment) HPI This is a 27 year old female with a four-day history of pain at the bottom of her lumbar spine. The pain is located in the center and is sharp. It is worse with movement or deep palpation. There is no associated radiation, numbness or weakness. There are no bowel or bladder changes. She describes the pain as moderate to severe. It has prevented her from sleeping. She has not been taking anything for it because "I don't like to take medication".  Past Medical History  Diagnosis Date  . No pertinent past medical history   . Miscarriage   . Anemia    Past Surgical History  Procedure Laterality Date  . Induced abortion  2010  . Dilation and curretage     Family History  Problem Relation Age of Onset  . Anesthesia problems Neg Hx    History  Substance Use Topics  . Smoking status: Never Smoker   . Smokeless tobacco: Never Used  . Alcohol Use: No   OB History   Grav Para Term Preterm Abortions TAB SAB Ect Mult Living   3 0 0 0 1 0 1 0 0 0      Review of Systems  All other systems reviewed and are negative.    Allergies  Catfish and Latex  Home Medications   Current Outpatient Rx  Name  Route  Sig  Dispense  Refill  . polyethylene glycol (MIRALAX) packet   Oral   Take 17 g by mouth daily.   14 each   0    BP 133/80  Pulse 68  Temp(Src) 98.6 F (37 C)  Resp 16  Ht 5\' 3"  (1.6 m)  Wt 230 lb (104.327 kg)  BMI 40.75 kg/m2  SpO2 98%  LMP 01/18/2013  Physical Exam General: Well-developed, well-nourished female in no acute distress; appearance consistent with age of record HENT: normocephalic, atraumatic Eyes: pupils equal round and reactive to light; extraocular muscles intact Neck: supple Heart: regular rate and rhythm Lungs: clear  to auscultation bilaterally Abdomen: soft; nondistended; nontender; bowel sounds present Back: Lower L. spine tenderness without step-off; negative straight leg raise bilaterally Extremities: No deformity; full range of motion; pulses normal Neurologic: Awake, alert and oriented; motor function intact in all extremities and symmetric; no facial droop Skin: Warm and dry Psychiatric: Normal mood and affect    ED Course  Procedures (including critical care time)  MDM   Nursing notes and vitals signs, including pulse oximetry, reviewed.  Summary of this visit's results, reviewed by myself:  Labs:  Results for orders placed during the hospital encounter of 01/26/13 (from the past 24 hour(s))  URINALYSIS, ROUTINE W REFLEX MICROSCOPIC     Status: Abnormal   Collection Time    01/26/13 12:37 AM      Result Value Range   Color, Urine YELLOW  YELLOW   APPearance CLOUDY (*) CLEAR   Specific Gravity, Urine 1.020  1.005 - 1.030   pH 6.0  5.0 - 8.0   Glucose, UA NEGATIVE  NEGATIVE mg/dL   Hgb urine dipstick NEGATIVE  NEGATIVE   Bilirubin Urine NEGATIVE  NEGATIVE   Ketones, ur NEGATIVE  NEGATIVE mg/dL   Protein, ur NEGATIVE  NEGATIVE mg/dL   Urobilinogen, UA 1.0  0.0 - 1.0 mg/dL   Nitrite NEGATIVE  NEGATIVE  Leukocytes, UA NEGATIVE  NEGATIVE  PREGNANCY, URINE     Status: None   Collection Time    01/26/13 12:37 AM      Result Value Range   Preg Test, Ur NEGATIVE  NEGATIVE     Hanley Seamen, MD 01/26/13 [phone]

## 2013-01-26 NOTE — ED Notes (Signed)
Pt c/o lower back pain and tender to touch x 4 days

## 2013-01-26 NOTE — ED Notes (Signed)
MD at bedside. 

## 2013-08-22 ENCOUNTER — Other Ambulatory Visit: Payer: Self-pay | Admitting: Infectious Disease

## 2013-08-22 ENCOUNTER — Ambulatory Visit
Admission: RE | Admit: 2013-08-22 | Discharge: 2013-08-22 | Disposition: A | Discharge status: Home / Self Care | Payer: No Typology Code available for payment source | Source: Ambulatory Visit | Attending: Infectious Disease | Admitting: Infectious Disease

## 2013-08-22 DIAGNOSIS — Z111 Encounter for screening for respiratory tuberculosis: Secondary | ICD-10-CM

## 2014-05-14 ENCOUNTER — Encounter (HOSPITAL_BASED_OUTPATIENT_CLINIC_OR_DEPARTMENT_OTHER): Payer: Self-pay | Admitting: *Deleted

## 2015-11-25 ENCOUNTER — Emergency Department (HOSPITAL_COMMUNITY)
Admission: EM | Admit: 2015-11-25 | Discharge: 2015-11-25 | Disposition: A | Discharge status: Home / Self Care | Payer: BLUE CROSS/BLUE SHIELD | Attending: Emergency Medicine | Admitting: Emergency Medicine

## 2015-11-25 ENCOUNTER — Emergency Department (HOSPITAL_COMMUNITY): Payer: BLUE CROSS/BLUE SHIELD

## 2015-11-25 ENCOUNTER — Encounter (HOSPITAL_COMMUNITY): Payer: Self-pay | Admitting: Emergency Medicine

## 2015-11-25 DIAGNOSIS — Z79899 Other long term (current) drug therapy: Secondary | ICD-10-CM | POA: Diagnosis not present

## 2015-11-25 DIAGNOSIS — R1031 Right lower quadrant pain: Secondary | ICD-10-CM | POA: Insufficient documentation

## 2015-11-25 DIAGNOSIS — R1032 Left lower quadrant pain: Secondary | ICD-10-CM | POA: Insufficient documentation

## 2015-11-25 DIAGNOSIS — R112 Nausea with vomiting, unspecified: Secondary | ICD-10-CM | POA: Diagnosis not present

## 2015-11-25 DIAGNOSIS — R109 Unspecified abdominal pain: Secondary | ICD-10-CM

## 2015-11-25 DIAGNOSIS — Z91040 Latex allergy status: Secondary | ICD-10-CM | POA: Insufficient documentation

## 2015-11-25 LAB — CBC
HCT: 38 % (ref 36.0–46.0)
HEMOGLOBIN: 12.4 g/dL (ref 12.0–15.0)
MCH: 30 pg (ref 26.0–34.0)
MCHC: 32.6 g/dL (ref 30.0–36.0)
MCV: 92 fL (ref 78.0–100.0)
Platelets: 266 10*3/uL (ref 150–400)
RBC: 4.13 MIL/uL (ref 3.87–5.11)
RDW: 13 % (ref 11.5–15.5)
WBC: 5.9 10*3/uL (ref 4.0–10.5)

## 2015-11-25 LAB — COMPREHENSIVE METABOLIC PANEL
ALBUMIN: 4.7 g/dL (ref 3.5–5.0)
ALK PHOS: 84 U/L (ref 38–126)
ALT: 12 U/L — AB (ref 14–54)
ANION GAP: 10 (ref 5–15)
AST: 22 U/L (ref 15–41)
BUN: 10 mg/dL (ref 6–20)
CHLORIDE: 104 mmol/L (ref 101–111)
CO2: 23 mmol/L (ref 22–32)
CREATININE: 0.72 mg/dL (ref 0.44–1.00)
Calcium: 9.7 mg/dL (ref 8.9–10.3)
GFR calc non Af Amer: >60 mL/min (ref >60)
GLUCOSE: 94 mg/dL (ref 65–99)
Potassium: 3.9 mmol/L (ref 3.5–5.1)
SODIUM: 137 mmol/L (ref 135–145)
Total Bilirubin: 1 mg/dL (ref 0.3–1.2)
Total Protein: 9 g/dL — ABNORMAL HIGH (ref 6.5–8.1)

## 2015-11-25 LAB — URINALYSIS, ROUTINE W REFLEX MICROSCOPIC
Bilirubin Urine: NEGATIVE
GLUCOSE, UA: NEGATIVE mg/dL
HGB URINE DIPSTICK: NEGATIVE
KETONES UR: NEGATIVE mg/dL
Leukocytes, UA: NEGATIVE
Nitrite: NEGATIVE
PROTEIN: NEGATIVE mg/dL
SPECIFIC GRAVITY, URINE: 1.013 (ref 1.005–1.030)
pH: 6 (ref 5.0–8.0)

## 2015-11-25 LAB — LIPASE, BLOOD: LIPASE: 20 U/L (ref 11–51)

## 2015-11-25 LAB — POC URINE PREG, ED: PREG TEST UR: NEGATIVE

## 2015-11-25 MED ORDER — DICYCLOMINE HCL 10 MG/ML IM SOLN
10.0000 mg | Freq: Once | INTRAMUSCULAR | Status: AC
  Administered 2015-11-25: 10 mg via INTRAMUSCULAR
  Filled 2015-11-25: qty 2

## 2015-11-25 MED ORDER — TRAMADOL HCL 50 MG PO TABS: 50.0000 mg | ORAL_TABLET | Freq: Four times a day (QID) | ORAL | Status: DC | PRN

## 2015-11-25 MED ORDER — SODIUM CHLORIDE 0.9 % IV BOLUS (SEPSIS)
1000.0000 mL | Freq: Once | INTRAVENOUS | Status: AC
  Administered 2015-11-25: 1000 mL via INTRAVENOUS

## 2015-11-25 MED ORDER — ONDANSETRON 4 MG PO TBDP: ORAL_TABLET | ORAL | Status: DC

## 2015-11-25 MED ORDER — DICYCLOMINE HCL 20 MG PO TABS: 20.0000 mg | ORAL_TABLET | Freq: Two times a day (BID) | ORAL | Status: DC

## 2015-11-25 MED ORDER — FENTANYL CITRATE (PF) 100 MCG/2ML IJ SOLN
50.0000 ug | Freq: Once | INTRAMUSCULAR | Status: AC
  Administered 2015-11-25: 50 ug via INTRAVENOUS
  Filled 2015-11-25: qty 2

## 2015-11-25 MED ORDER — ONDANSETRON HCL 4 MG/2ML IJ SOLN
4.0000 mg | Freq: Once | INTRAMUSCULAR | Status: AC
  Administered 2015-11-25: 4 mg via INTRAVENOUS
  Filled 2015-11-25: qty 2

## 2015-11-25 MED ORDER — DICYCLOMINE HCL 10 MG PO CAPS: 10.0000 mg | ORAL_CAPSULE | Freq: Once | ORAL | Status: DC

## 2015-11-25 NOTE — Discharge Instructions (Signed)
There is not appear to be an emergent cause for your symptoms at this time. Your exam, labs and x-ray were all reassuring. Please take your medicines as prescribed and as we discussed. Follow up with your doctor in the next 1-2 days for reevaluation. Return to ED for new or worsening symptoms as we discussed.  Abdominal Pain, Adult Many things can cause abdominal pain. Usually, abdominal pain is not caused by a disease and will improve without treatment. It can often be observed and treated at home. Your health care provider will do a physical exam and possibly order blood tests and X-rays to help determine the seriousness of your pain. However, in many cases, more time must pass before a clear cause of the pain can be found. Before that point, your health care provider may not know if you need more testing or further treatment. HOME CARE INSTRUCTIONS Monitor your abdominal pain for any changes. The following actions may help to alleviate any discomfort you are experiencing:  Only take over-the-counter or prescription medicines as directed by your health care provider.  Do not take laxatives unless directed to do so by your health care provider.  Try a clear liquid diet (broth, tea, or water) as directed by your health care provider. Slowly move to a bland diet as tolerated. SEEK MEDICAL CARE IF:  You have unexplained abdominal pain.  You have abdominal pain associated with nausea or diarrhea.  You have pain when you urinate or have a bowel movement.  You experience abdominal pain that wakes you in the night.  You have abdominal pain that is worsened or improved by eating food.  You have abdominal pain that is worsened with eating fatty foods.  You have a fever. SEEK IMMEDIATE MEDICAL CARE IF:  Your pain does not go away within 2 hours.  You keep throwing up (vomiting).  Your pain is felt only in portions of the abdomen, such as the right side or the left lower portion of the  abdomen.  You pass bloody or black tarry stools. MAKE SURE YOU:  Understand these instructions.  Will watch your condition.  Will get help right away if you are not doing well or get worse.   This information is not intended to replace advice given to you by your health care provider. Make sure you discuss any questions you have with your health care provider.   Document Released: 04/08/2005 Document Revised: 03/20/2015 Document Reviewed: 03/08/2013 Elsevier Interactive Patient Education [employer]2016 [employer]

## 2015-11-25 NOTE — ED Provider Notes (Signed)
CSN: [id number]     Arrival date & time 11/25/15  4098 History   First MD Initiated Contact with Patient 11/25/15 [phone]     Chief Complaint  Patient presents with  . Abdominal Pain     (Consider location/radiation/quality/duration/timing/severity/associated sxs/prior Treatment) HPI Susan Zhang is a 30 y.o. female here for evaluation of abdominal discomfort. Patient reports she began to experience sharp, crampy abdominal discomfort in her bilateral lower abdomen, onset at 7 PM last night. She reports associated nausea and vomiting, nonbloody and nonbilious. He states at approximately 3:00 AM this morning, her stool turned from "normal brown to jelly blood". Reports she is mildly dizzy now. Denies any other chest pain, shortness of breath, hemoptysis, leg swelling, urinary symptoms, back pain, vaginal bleeding or discharge. Last menstrual period was last month and was normal for her. Has not tried anything to improve her symptoms. Nothing makes the problem better or worse.  Past Medical History  Diagnosis Date  . No pertinent past medical history   . Miscarriage   . Anemia    Past Surgical History  Procedure Laterality Date  . Induced abortion  2010  . Dilation and curretage     Family History  Problem Relation Age of Onset  . Anesthesia problems Neg Hx    Social History  Substance Use Topics  . Smoking status: Never Smoker   . Smokeless tobacco: Never Used  . Alcohol Use: No   OB History    Gravida Para Term Preterm AB TAB SAB Ectopic Multiple Living       Review of Systems A 10 point review of systems was completed and was negative except for pertinent positives and negatives as mentioned in the history of present illness     Allergies  Shrimp; Catfish; and Latex  Home Medications   Prior to Admission medications   Medication Sig Start Date End Date Taking? Authorizing Provider  dicyclomine (BENTYL) 20 MG tablet Take 1 tablet (20 mg total) by  mouth 2 (two) times daily. 11/25/15   Joycie Peek, PA-C  ondansetron (ZOFRAN ODT) 4 MG disintegrating tablet  ODT q4 hours prn nausea/vomit 11/25/15   Joycie Peek, PA-C  traMADol (ULTRAM) 50 MG tablet Take 1 tablet (50 mg total) by mouth every 6 (six) hours as needed. 11/25/15   Joycie Peek, PA-C   BP 122/74 mmHg  Pulse 67  Temp(Src) 98.4 F (36.9 C) (Oral)  Resp 16  SpO2 100%  LMP 11/03/2015 Physical Exam  Constitutional: She is oriented to person, place, and time. She appears well-developed and well-nourished.  HENT:  Head: Normocephalic and atraumatic.  Mouth/Throat: Oropharynx is clear and moist.  Eyes: Conjunctivae are normal. Pupils are equal, round, and reactive to light. Right eye exhibits no discharge. Left eye exhibits no discharge. No scleral icterus.  Neck: Neck supple.  Cardiovascular: Normal rate, regular rhythm and normal heart sounds.   Pulmonary/Chest: Effort normal and breath sounds normal. No respiratory distress. She has no wheezes. She has no rales.  Abdominal: Soft.  Abdomen is soft and nondistended. Discomfort in bilateral lower abdomen without any increased tenderness to palpation. No peritoneal signs no other rash, lesions or deformities noted  Genitourinary: Guaiac negative stool.  Female chaperone present for rectal exam. No hemorrhoids, fissures. Soft brown stool on exam glove. No overt bleeding. Fecal occult negative  Musculoskeletal: She exhibits no tenderness.  Neurological: She is alert and oriented to person, place, and time.  Cranial  Nerves II-XII grossly intact  Skin: Skin is warm and dry. No rash noted.  Psychiatric: She has a normal mood and affect.  Nursing note and vitals reviewed.   ED Course  Procedures (including critical care time) Labs Review Labs Reviewed  COMPREHENSIVE METABOLIC PANEL - Abnormal; Notable for the following:    Total Protein 9.0 (*)    ALT 12 (*)    All other components within normal limits  LIPASE,  BLOOD  CBC  URINALYSIS, ROUTINE W REFLEX MICROSCOPIC (NOT [facility])  POC URINE PREG, ED  POC URINE PREG, ED    Imaging Review Dg Abd Acute W/chest  11/25/2015  CLINICAL DATA:  Abdominal pain with nausea, vomiting, and diarrhea EXAM: DG ABDOMEN ACUTE W/ 1V CHEST COMPARISON:  Chest radiograph August 22, 2013; abdominal series September 06, 2011 FINDINGS: PA chest: Lungs are clear. Heart size and pulmonary vascularity are normal. No adenopathy. There is a hypoplastic left first rib. Supine and upright abdomen: There is no bowel dilatation or air-fluid level suggesting obstruction. No free air. There is moderate stool in the colon. There are phleboliths in the pelvis. IMPRESSION: No bowel obstruction or free air.  No lung edema or consolidation. Electronically Signed   By: Bretta BangWilliam  Woodruff III M.D.   On: 11/25/2015 10:34   I have personally reviewed and evaluated these images and lab results as part of my medical decision-making.   EKG Interpretation None     Filed Vitals:   11/25/15 0648 11/25/15 1138  BP: 135/94 122/74  Pulse: 78 67  Temp: 98.4 F (36.9 C)   TempSrc: Oral   Resp: 16 16  SpO2: 100% 100%   Meds given in ED:  Medications  ondansetron (ZOFRAN) injection 4 mg (4 mg Intravenous Given 11/25/15 0900)  sodium chloride 0.9 % bolus 1,000 mL (0 mLs Intravenous Stopped 11/25/15 1008)  dicyclomine (BENTYL) injection 10 mg (10 mg Intramuscular Given 11/25/15 0900)  fentaNYL (SUBLIMAZE) injection 50 mcg (50 mcg Intravenous Given 11/25/15 1143)    Discharge Medication List as of 11/25/2015 11:44 AM    START taking these medications   Details  dicyclomine (BENTYL) 20 MG tablet Take 1 tablet (20 mg total) by mouth 2 (two) times daily., Starting 11/25/2015, Until Discontinued, Print    ondansetron (ZOFRAN ODT) 4 MG disintegrating tablet 4mg  ODT q4 hours prn nausea/vomit, Print    traMADol (ULTRAM) 50 MG tablet Take 1 tablet (50 mg total) by mouth every 6 (six) hours as needed.,  Starting 11/25/2015, Until Discontinued, Print        MDM  Susan Zhang is a 30 y.o. female here for evaluation of nausea, vomiting and diarrhea.. She has very mild lower abdominal tenderness. No peritoneal signs or evidence of acute or surgical abdomen. Hemoccult-negative. She remains afebrile and hemodynamically stable. Screening labs are unremarkable, no leukocytosis or urinary infection. Pregnancy negative. Plain films of the chest and abdomen are unremarkable. Pain improved with analgesia in emergency department. Tolerating oral fluids. Low suspicion for intussusception. Discussed strict return precautions with patient and family at bedside. Specifically for appendicitis as this can be early presentations. They verbalized understanding and agrees to return for any new or worsening symptoms. DC with short course pain and nausea medicine. Overall appears well and appropriate for discharge. Final diagnoses:  Abdominal discomfort        Joycie PeekBenjamin Fonda Rochon, PA-C 11/25/15 1239  Leta BaptistEmily Roe Nguyen, MD 12/03/15 [phone]

## 2015-11-25 NOTE — ED Notes (Signed)
Pt reports abd pain with n/v/d since last night. Has been vomiting a lot today and is not tolerating PO today.  Pt made aware that she has PO med for pain ordered, requesting IV med d/t not tolerating PO at this time. Ben EDPA notified and ordered IM med.

## 2015-11-25 NOTE — ED Notes (Signed)
POC urine preg negative at [phone]

## 2015-11-25 NOTE — ED Notes (Signed)
Pt ambulated to the BR with steady gait with family assist.

## 2015-11-25 NOTE — ED Notes (Signed)
Pt c/o sudden onset lower abdominal pain since last night at 8pm. Pt c/o N/V/D. Pt sts that diarrhea is tinged with blood this morning. Pt sts "It looks like cold." Pt unable to go into detail about what that means. A&Ox4 and ambulatory.

## 2015-11-30 ENCOUNTER — Emergency Department (HOSPITAL_COMMUNITY): Payer: BLUE CROSS/BLUE SHIELD

## 2015-11-30 ENCOUNTER — Encounter (HOSPITAL_COMMUNITY): Payer: Self-pay

## 2015-11-30 ENCOUNTER — Emergency Department (HOSPITAL_COMMUNITY)
Admission: EM | Admit: 2015-11-30 | Discharge: 2015-11-30 | Disposition: A | Discharge status: Home / Self Care | Payer: BLUE CROSS/BLUE SHIELD | Attending: Emergency Medicine | Admitting: Emergency Medicine

## 2015-11-30 DIAGNOSIS — I3139 Other pericardial effusion (noninflammatory): Secondary | ICD-10-CM

## 2015-11-30 DIAGNOSIS — Z91040 Latex allergy status: Secondary | ICD-10-CM | POA: Insufficient documentation

## 2015-11-30 DIAGNOSIS — R109 Unspecified abdominal pain: Secondary | ICD-10-CM | POA: Diagnosis present

## 2015-11-30 DIAGNOSIS — Z3202 Encounter for pregnancy test, result negative: Secondary | ICD-10-CM | POA: Diagnosis not present

## 2015-11-30 DIAGNOSIS — Z862 Personal history of diseases of the blood and blood-forming organs and certain disorders involving the immune mechanism: Secondary | ICD-10-CM | POA: Diagnosis not present

## 2015-11-30 DIAGNOSIS — Z79899 Other long term (current) drug therapy: Secondary | ICD-10-CM | POA: Diagnosis not present

## 2015-11-30 DIAGNOSIS — R110 Nausea: Secondary | ICD-10-CM | POA: Diagnosis not present

## 2015-11-30 DIAGNOSIS — R1084 Generalized abdominal pain: Secondary | ICD-10-CM | POA: Diagnosis not present

## 2015-11-30 DIAGNOSIS — I313 Pericardial effusion (noninflammatory): Secondary | ICD-10-CM | POA: Diagnosis not present

## 2015-11-30 LAB — COMPREHENSIVE METABOLIC PANEL
ALT: 9 U/L — ABNORMAL LOW (ref 14–54)
AST: 19 U/L (ref 15–41)
Albumin: 3.6 g/dL (ref 3.5–5.0)
Alkaline Phosphatase: 77 U/L (ref 38–126)
Anion gap: 9 (ref 5–15)
BILIRUBIN TOTAL: 0.8 mg/dL (ref 0.3–1.2)
BUN: 6 mg/dL (ref 6–20)
CO2: 25 mmol/L (ref 22–32)
Calcium: 9.2 mg/dL (ref 8.9–10.3)
Chloride: 104 mmol/L (ref 101–111)
Creatinine, Ser: 0.8 mg/dL (ref 0.44–1.00)
Glucose, Bld: 82 mg/dL (ref 65–99)
POTASSIUM: 4.1 mmol/L (ref 3.5–5.1)
Sodium: 138 mmol/L (ref 135–145)
TOTAL PROTEIN: 7.2 g/dL (ref 6.5–8.1)

## 2015-11-30 LAB — BASIC METABOLIC PANEL
Anion gap: 7 (ref 5–15)
BUN: 7 mg/dL (ref 6–20)
CO2: 24 mmol/L (ref 22–32)
CREATININE: 0.8 mg/dL (ref 0.44–1.00)
Calcium: 9.6 mg/dL (ref 8.9–10.3)
Chloride: 108 mmol/L (ref 101–111)
GFR calc non Af Amer: >60 mL/min (ref >60)
Glucose, Bld: 84 mg/dL (ref 65–99)
Potassium: 4 mmol/L (ref 3.5–5.1)
SODIUM: 139 mmol/L (ref 135–145)

## 2015-11-30 LAB — CBC
HCT: 37.9 % (ref 36.0–46.0)
Hemoglobin: 12.3 g/dL (ref 12.0–15.0)
MCH: 29.6 pg (ref 26.0–34.0)
MCHC: 32.5 g/dL (ref 30.0–36.0)
MCV: 91.3 fL (ref 78.0–100.0)
PLATELETS: 257 10*3/uL (ref 150–400)
RBC: 4.15 MIL/uL (ref 3.87–5.11)
RDW: 13 % (ref 11.5–15.5)
WBC: 4 10*3/uL (ref 4.0–10.5)

## 2015-11-30 LAB — LIPASE, BLOOD: LIPASE: 21 U/L (ref 11–51)

## 2015-11-30 LAB — I-STAT TROPONIN, ED: TROPONIN I, POC: 0 ng/mL (ref 0.00–0.08)

## 2015-11-30 LAB — HCG, SERUM, QUALITATIVE: PREG SERUM: NEGATIVE

## 2015-11-30 MED ORDER — MORPHINE SULFATE (PF) 4 MG/ML IV SOLN
4.0000 mg | INTRAVENOUS | Status: DC | PRN
  Administered 2015-11-30: 4 mg via INTRAVENOUS
  Filled 2015-11-30: qty 1

## 2015-11-30 MED ORDER — OMEPRAZOLE 20 MG PO CPDR: 20.0000 mg | DELAYED_RELEASE_CAPSULE | Freq: Two times a day (BID) | ORAL | Status: DC

## 2015-11-30 MED ORDER — ONDANSETRON HCL 4 MG/2ML IJ SOLN
4.0000 mg | Freq: Once | INTRAMUSCULAR | Status: AC
  Administered 2015-11-30: 4 mg via INTRAVENOUS
  Filled 2015-11-30: qty 2

## 2015-11-30 MED ORDER — ONDANSETRON HCL 4 MG/2ML IJ SOLN: 4.0000 mg | Freq: Once | INTRAMUSCULAR | Status: DC

## 2015-11-30 MED ORDER — SODIUM CHLORIDE 0.9 % IV BOLUS (SEPSIS)
1000.0000 mL | Freq: Once | INTRAVENOUS | Status: AC
  Administered 2015-11-30: 1000 mL via INTRAVENOUS

## 2015-11-30 MED ORDER — MORPHINE SULFATE (PF) 2 MG/ML IV SOLN
2.0000 mg | INTRAVENOUS | Status: DC | PRN
  Administered 2015-11-30: 2 mg via INTRAVENOUS
  Filled 2015-11-30: qty 1

## 2015-11-30 MED ORDER — IOPAMIDOL (ISOVUE-300) INJECTION 61%
INTRAVENOUS | Status: AC
  Administered 2015-11-30: 100 mL
  Filled 2015-11-30: qty 100

## 2015-11-30 MED ORDER — ONDANSETRON 4 MG PO TBDP: 4.0000 mg | ORAL_TABLET | Freq: Three times a day (TID) | ORAL | Status: DC | PRN

## 2015-11-30 NOTE — Discharge Instructions (Signed)
Your CT scan of your abdomen shows no abnormalities. Incidentally noted on your CT scan is a trace of fluid around her heart called a pericardial effusion. It is recommended you follow-up with her primary care physician to schedule outpatient ultrasound of your heart to ensure that this fluid is not changing.     Pericardial Effusion Pericardial effusion is a buildup of fluid around your heart. The heart is surrounded by a double-layered sac (pericardium). This sac normally contains a small amount of fluid. When too much builds up, it can put pressure on your heart and cause problems. As fluid builds up in the pericardial sac and pressure on your heart increases, it becomes harder for your heart to pump blood. When fluid prevents your heart from pumping enough blood, it is called cardiac tamponade. Cardiac tamponade is a life-threatening condition. CAUSES  Often the cause of pericardial effusion is not known (idiopathic effusion). Possible causes are from:  Infections, such as from a virus, bacteria, fungus, or parasite.  Damage to the pericardium from heart surgery or a heart attack.  Inflammatory diseases, such as rheumatoid arthritis or lupus.  Kidney disease.  Thyroid disease.  Cancer.  Cancer treatment, including radiation or chemotherapy.  Certain drugs, including tuberculosis drugs or seizure drugs.  Chest trauma. SIGNS AND SYMPTOMS  Pericardial effusion may not cause symptoms at first, especially if the fluid builds up slowly. In time, pressure on the heart may cause:  Chest pain.  Trouble breathing.  Pain and shortness of breath that is worse when lying down.  Dizziness.  Fainting.  Cough.  Hiccups.  Skipped heartbeats (palpitations).  Anxiety and confusion.  A bluish skin color (cyanosis).  Swollen legs and ankles. DIAGNOSIS  Your health care provider may suspect pericardial effusion based on your symptoms. Your health care provider may also do a  physical exam to check for:  Low blood pressure and weak pulses.  Soft (muffled) heart sounds.  Rapid heartbeat.  Full veins in your neck (distended jugular veins).  Decreased breathing sounds and a rubbing sound (friction rub) when listening to your lungs. Your health care provider may also do several tests to confirm the diagnosis and find out what is causing the pericardial effusion. These may include:  Chest X-ray.  Imaging study of the heart using sound waves (echocardiogram).  CT scan or MRI.  Electrical study of the heart (electrocardiogram [ECG]).  A procedure using a needle to remove fluid from the pericardium for examination (pericardiocentesis).  Blood tests to check for:  Infection.  Heart damage.  Thyroid abnormalities.  Kidney disease.  Inflammatory disorders. TREATMENT  Treatment for pericardial effusion depends on the cause of your symptoms and how severe your symptoms are. If a specific cause was found, that cause will be treated. Treatment may include:  Medicines, such as:  Nonsteroidal anti-inflammatory drugs (NSAIDs).  Other anti-inflammatory drugs, such as steroids.  Antibiotic medicine.  Antifungal medicine.  Hospital treatment may be necessary, such as for cardiac tamponade. This may include:  Intravenous (IV) fluids.  Breathing support.  Surgery may be needed in severe cases. Surgery may include:  Pericardiocentesis.  Open heart surgery.  A procedure to make a permanent opening in the pericardium (pericardial window). SEEK MEDICAL CARE IF:  You feel dizzy or light-headed.  You have swelling in your legs or ankles.  You have heart palpitations.  You have persistent cough or hiccups. SEEK IMMEDIATE MEDICAL CARE IF:   You faint.  You have chest pain.  You have trouble  breathing. These symptoms may represent a serious problem that is an emergency. Do not wait to see if the symptoms will go away. Get medical help right  away. Call your local emergency services (911 in the [place] Do not drive yourself to the hospital. MAKE SURE YOU:  Understand these instructions.  Will watch your condition.  Will get help right away if you are not doing well or get worse.   This information is not intended to replace advice given to you by your health care provider. Make sure you discuss any questions you have with your health care provider.   Document Released: 02/24/2005 Document Revised: 07/20/2014 Document Reviewed: 11/16/2013 Elsevier Interactive Patient Education 2016 Else[employer] Inc[employer] Abdominal Pain, Adult Many things can cause abdominal pain. Usually, abdominal pain is not caused by a disease and will improve without treatment. It can often be observed and treated at home. Your health care provider will do a physical exam and possibly order blood tests and X-rays to help determine the seriousness of your pain. However, in many cases, more time must pass before a clear cause of the pain can be found. Before that point, your health care provider may not know if you need more testing or further treatment. HOME CARE INSTRUCTIONS Monitor your abdominal pain for any changes. The following actions may help to alleviate any discomfort you are experiencing:  Only take over-the-counter or prescription medicines as directed by your health care provider.  Do not take laxatives unless directed to do so by your health care provider.  Try a clear liquid diet (broth, tea, or water) as directed by your health care provider. Slowly move to a bland diet as tolerated. SEEK MEDICAL CARE IF:  You have unexplained abdominal pain.  You have abdominal pain associated with nausea or diarrhea.  You have pain when you urinate or have a bowel movement.  You experience abdominal pain that wakes you in the night.  You have abdominal pain that is worsened or improved by eating food.  You have abdominal pain that is worsened with eating  fatty foods.  You have a fever. SEEK IMMEDIATE MEDICAL CARE IF:  Your pain does not go away within 2 hours.  You keep throwing up (vomiting).  Your pain is felt only in portions of the abdomen, such as the right side or the left lower portion of the abdomen.  You pass bloody or black tarry stools. MAKE SURE YOU:  Understand these instructions.  Will watch your condition.  Will get help right away if you are not doing well or get worse.   This information is not intended to replace advice given to you by your health care provider. Make sure you discuss any questions you have with your health care provider.   Document Released: 04/08/2005 Document Revised: 03/20/2015 Document Reviewed: 03/08/2013 Elsevier Interactive Patient Education [employer]

## 2015-11-30 NOTE — ED Notes (Signed)
Patient transported to X-ray 

## 2015-11-30 NOTE — ED Provider Notes (Signed)
CSN: [id number]     Arrival date & time 11/30/15  1052 History   First MD Initiated Contact with Patient 11/30/15 1202     Chief Complaint  Patient presents with  . Abdominal Pain  . Chest Pain      HPI  Patient presents for evaluation of abdominal pain. Symptoms been present for 6 or 7 days. Had reassuring labs 4 days  ago at [facility]. States her primary care physician wondered of the CT scan. She was waiting for preapproval. She is uncertain if her symptoms are worsening or just persisting but states she came here because "they wouldn't pre-approve it".  Denies vaginal bleeding or discharge. No fevers or chills. No nausea vomiting today per her last nausea was a few days ago.  Past Medical History  Diagnosis Date  . No pertinent past medical history   . Miscarriage   . Anemia    Past Surgical History  Procedure Laterality Date  . Induced abortion  2010  . Dilation and curretage     Family History  Problem Relation Age of Onset  . Anesthesia problems Neg Hx    Social History  Substance Use Topics  . Smoking status: Never Smoker   . Smokeless tobacco: Never Used  . Alcohol Use: No   OB History    Gravida Para Term Preterm AB TAB SAB Ectopic Multiple Living   3 0 0 0 1 0 1 0 0 0      Review of Systems  Constitutional: Negative for fever, chills, diaphoresis, appetite change and fatigue.  HENT: Negative for mouth sores, sore throat and trouble swallowing.   Eyes: Negative for visual disturbance.  Respiratory: Negative for cough, chest tightness, shortness of breath and wheezing.   Cardiovascular: Negative for chest pain.  Gastrointestinal: Positive for nausea and abdominal pain. Negative for vomiting, diarrhea and abdominal distention.  Endocrine: Negative for polydipsia, polyphagia and polyuria.  Genitourinary: Negative for dysuria, frequency and hematuria.  Musculoskeletal: Negative for gait problem.  Skin: Negative for color change, pallor and rash.   Neurological: Negative for dizziness, syncope, light-headedness and headaches.  Hematological: Does not bruise/bleed easily.  Psychiatric/Behavioral: Negative for behavioral problems and confusion.      Allergies  Shrimp; Catfish; and Latex  Home Medications   Prior to Admission medications   Medication Sig Start Date End Date Taking? Authorizing Provider  dicyclomine (BENTYL) 20 MG tablet Take 1 tablet (20 mg total) by mouth 2 (two) times daily. 11/25/15   Joycie Peek, PA-C  omeprazole (PRILOSEC) 20 MG capsule Take 1 capsule (20 mg total) by mouth 2 (two) times daily. 11/30/15   Rolland Porter, MD  ondansetron (ZOFRAN ODT) 4 MG disintegrating tablet Take 1 tablet (4 mg total) by mouth every 8 (eight) hours as needed for nausea. 11/30/15   Rolland Porter, MD  traMADol (ULTRAM) 50 MG tablet Take 1 tablet (50 mg total) by mouth every 6 (six) hours as needed. 11/25/15   Benjamin Cartner, PA-C   BP 108/68 mmHg  Pulse 61  Temp(Src) 98.1 F (36.7 C) (Oral)  Resp 20  Ht 5\' 7"  (1.702 m)  Wt 269 lb 11.2 oz (122.335 kg)  BMI 42.23 kg/m2  SpO2 100%  LMP 11/03/2015 Physical Exam  Constitutional: She is oriented to person, place, and time. She appears well-developed and well-nourished. No distress.  HENT:  Head: Normocephalic.  Eyes: Conjunctivae are normal. Pupils are equal, round, and reactive to light. No scleral icterus.  Neck: Normal range of motion. Neck supple.  No thyromegaly present.  Cardiovascular: Normal rate and regular rhythm.  Exam reveals no gallop and no friction rub.   No murmur heard. Pulmonary/Chest: Effort normal and breath sounds normal. No respiratory distress. She has no wheezes. She has no rales.  Abdominal: Soft. Bowel sounds are normal. She exhibits no distension. There is no tenderness. There is no rebound.  Soft benign abdomen. No guarding or rebound.  Musculoskeletal: Normal range of motion.  Neurological: She is alert and oriented to person, place, and time.   Skin: Skin is warm and dry. No rash noted.  Psychiatric: She has a normal mood and affect. Her behavior is normal.    ED Course  Procedures (including critical care time) Labs Review Labs Reviewed  COMPREHENSIVE METABOLIC PANEL - Abnormal; Notable for the following:    ALT 9 (*)    All other components within normal limits  BASIC METABOLIC PANEL  CBC  LIPASE, BLOOD  HCG, SERUM, QUALITATIVE  URINALYSIS, ROUTINE W REFLEX MICROSCOPIC (NOT AT [facility])  I-STAT TROPOININ, ED    Imaging Review Dg Chest 2 View  11/30/2015  CLINICAL DATA:  Pt seen last week at [facility] for abd cramping, jelly like stool on 11-24-15. Pt seen PCP on 11-28-15 and pt was told to go to ED to get CT scan. Pt has not improved. No stool since 11-24-15, vomited x 1 today and now mid upper chest pain that is constant. EXAM: CHEST  2 VIEW COMPARISON:  None. FINDINGS: The heart size and mediastinal contours are within normal limits. Both lungs are clear. No pleural effusion or pneumothorax. The visualized skeletal structures are unremarkable. IMPRESSION: Normal chest radiographs. Electronically Signed   By: Amie Portlandavid  Ormond M.D.   On: 11/30/2015 12:04   Ct Abdomen Pelvis W Contrast  11/30/2015  CLINICAL DATA:  30 year old female with a history of mid abdominal pain EXAM: CT ABDOMEN AND PELVIS WITH CONTRAST TECHNIQUE: Multidetector CT imaging of the abdomen and pelvis was performed using the standard protocol following bolus administration of intravenous contrast. CONTRAST:  100mL ISOVUE-300 IOPAMIDOL (ISOVUE-300) INJECTION 61% COMPARISON:  Plain film 11/25/2015 FINDINGS: Lower chest: Unremarkable appearance of the soft tissues of the chest wall. Heart size within normal limits. Trace anterior pericardial fluid/thickening. No lower mediastinal adenopathy. Unremarkable appearance of the distal esophagus. No hiatal hernia. No confluent airspace disease, pleural fluid, or pneumothorax within visualized lung. Abdomen/pelvis: Unremarkable  appearance of liver and spleen. Unremarkable appearance of bilateral adrenal glands. No peripancreatic or pericholecystic fluid or inflammatory changes. No radio-opaque gallstones. No intrahepatic or extrahepatic biliary ductal dilatation. No intra-peritoneal free air or significant free-fluid. No abnormally dilated small bowel or colon. No transition point. No inflammatory changes of the mesenteries. Normal appendix identified. No diverticular disease. Right Kidney/Ureter: No hydronephrosis. No nephrolithiasis. No perinephric stranding. Unremarkable course of the right ureter. Left Kidney/Ureter: No hydronephrosis. No nephrolithiasis. No perinephric stranding. Unremarkable course of the left ureter. Unremarkable appearance of the urinary bladder. Unremarkable appearance of the uterus. Nonspecific pelvic floor laxity. Physiologic changes of the adnexa. No significant vascular calcification. No aneurysm or periaortic fluid identified. Musculoskeletal: No displaced fracture identified. No significant degenerative changes of the spine. IMPRESSION: No acute finding of the abdomen/pelvis. Physiologic changes of the adnexa. Trace pericardial fluid/ thickening, nonspecific. If there is concern for acute cardiac/pericardial abnormality, recommend correlation with patient presentation and lab values. Signed, Yvone NeuJaime S. Loreta AveWagner, DO [facility] Electronically Signed   By: Gilmer MorJaime  Wagner D.O.   On: 11/30/2015 14:50   I  have personally reviewed and evaluated these images and lab results as part of my medical decision-making.   EKG Interpretation   Date/Time:  Saturday Nov 30 2015 11:00:37 EDT Ventricular Rate:  67 PR Interval:  172 QRS Duration: 74 QT Interval:  372 QTC Calculation: 393 R Axis:   6 Text Interpretation:  Normal sinus rhythm with sinus arrhythmia Low  voltage QRS Borderline ECG Confirmed by Fayrene Fearing  MD, Janera Peugh ([id number]) on  11/30/2015 3:12:09 PM       MDM   Final diagnoses:  Generalized abdominal pain  Pericardial effusion    Assuring CT. Discussed the pericardial effusion with her. She does not have a rub. She does not have pleuritic discomfort. I believe this is an incidental finding. We did discuss importance of follow-up with this. She will talk to her primary care physician about outpatient echo. Given her copy to CT report. Otherwise her labs are reassuring. Her CT is reassuring. I think we have eliminated diverticulitis colitis appendicitis very likely have eliminated inflammatory bowel disease with a normal CT and no acidosis. Plan will be expectant management. Zofran. Clinically. Advancing diet. ER with any changes.    Rolland Porter, MD 11/30/15 1623

## 2015-11-30 NOTE — ED Notes (Signed)
[facility], jelly like stool on 11-24-15.  Pt seen PCP on 11-28-15 and pt was told to go to ED to get CT scan.  Pt has not improved.  No stool since 11-24-15, vomited x 1 today and now mid upper chest pain that is constant.

## 2016-02-27 ENCOUNTER — Other Ambulatory Visit: Payer: Self-pay | Admitting: Otolaryngology

## 2016-02-27 DIAGNOSIS — M542 Cervicalgia: Secondary | ICD-10-CM

## 2016-03-06 ENCOUNTER — Ambulatory Visit
Admission: RE | Admit: 2016-03-06 | Discharge: 2016-03-06 | Disposition: A | Discharge status: Home / Self Care | Payer: BLUE CROSS/BLUE SHIELD | Source: Ambulatory Visit | Attending: Otolaryngology | Admitting: Otolaryngology

## 2016-03-06 DIAGNOSIS — M542 Cervicalgia: Secondary | ICD-10-CM

## 2016-03-06 MED ORDER — IOPAMIDOL (ISOVUE-370) INJECTION 76%
100.0000 mL | Freq: Once | INTRAVENOUS | Status: AC | PRN
Start: 2016-03-06 — End: 2016-03-06
  Administered 2016-03-06: 100 mL via INTRAVENOUS

## 2016-04-08 ENCOUNTER — Inpatient Hospital Stay (HOSPITAL_COMMUNITY): Payer: BLUE CROSS/BLUE SHIELD

## 2016-04-08 ENCOUNTER — Inpatient Hospital Stay (HOSPITAL_COMMUNITY)
Admission: AD | Admit: 2016-04-08 | Discharge: 2016-04-09 | Disposition: A | Discharge status: Home / Self Care | Payer: BLUE CROSS/BLUE SHIELD | Source: Ambulatory Visit | Attending: Obstetrics and Gynecology | Admitting: Obstetrics and Gynecology

## 2016-04-08 ENCOUNTER — Encounter (HOSPITAL_COMMUNITY): Payer: Self-pay

## 2016-04-08 DIAGNOSIS — O26891 Other specified pregnancy related conditions, first trimester: Secondary | ICD-10-CM

## 2016-04-08 DIAGNOSIS — Z3A01 Less than 8 weeks gestation of pregnancy: Secondary | ICD-10-CM | POA: Diagnosis not present

## 2016-04-08 DIAGNOSIS — O30001 Twin pregnancy, unspecified number of placenta and unspecified number of amniotic sacs, first trimester: Secondary | ICD-10-CM | POA: Diagnosis not present

## 2016-04-08 DIAGNOSIS — N949 Unspecified condition associated with female genital organs and menstrual cycle: Secondary | ICD-10-CM | POA: Diagnosis not present

## 2016-04-08 DIAGNOSIS — R102 Pelvic and perineal pain: Secondary | ICD-10-CM | POA: Insufficient documentation

## 2016-04-08 DIAGNOSIS — O219 Vomiting of pregnancy, unspecified: Secondary | ICD-10-CM

## 2016-04-08 DIAGNOSIS — O210 Mild hyperemesis gravidarum: Secondary | ICD-10-CM | POA: Insufficient documentation

## 2016-04-08 LAB — URINALYSIS, ROUTINE W REFLEX MICROSCOPIC
BILIRUBIN URINE: NEGATIVE
GLUCOSE, UA: NEGATIVE mg/dL
HGB URINE DIPSTICK: NEGATIVE
Ketones, ur: NEGATIVE mg/dL
Leukocytes, UA: NEGATIVE
Nitrite: NEGATIVE
Protein, ur: NEGATIVE mg/dL
pH: 6 (ref 5.0–8.0)

## 2016-04-08 LAB — CBC
HEMATOCRIT: 29 % — AB (ref 36.0–46.0)
Hemoglobin: 9.8 g/dL — ABNORMAL LOW (ref 12.0–15.0)
MCH: 30.2 pg (ref 26.0–34.0)
MCHC: 33.8 g/dL (ref 30.0–36.0)
MCV: 89.5 fL (ref 78.0–100.0)
Platelets: 200 10*3/uL (ref 150–400)
RBC: 3.24 MIL/uL — ABNORMAL LOW (ref 3.87–5.11)
RDW: 13.5 % (ref 11.5–15.5)
WBC: 5.1 10*3/uL (ref 4.0–10.5)

## 2016-04-08 LAB — POCT PREGNANCY, URINE: PREG TEST UR: POSITIVE — AB

## 2016-04-08 MED ORDER — PROMETHAZINE HCL 25 MG/ML IJ SOLN
25.0000 mg | Freq: Once | INTRAMUSCULAR | Status: AC
  Administered 2016-04-08: 25 mg via INTRAVENOUS
  Filled 2016-04-08: qty 1

## 2016-04-08 NOTE — MAU Provider Note (Signed)
History     CSN: [id number]  Arrival date and time: 04/08/16 2212   First Provider Initiated Contact with Patient 04/08/16 2324      Chief Complaint  Patient presents with  . Emesis During Pregnancy   Emesis   This is a new problem. The current episode started in the past 7 days. The problem occurs 5 to 10 times per day. The problem has been unchanged. The emesis has an appearance of stomach contents. There has been no fever. Associated symptoms include abdominal pain. Pertinent negatives include no chills, diarrhea or fever. Risk factors: pregnancy  She has tried nothing for the symptoms.  Pelvic Pain  The patient's primary symptoms include pelvic pain. This is a new problem. The current episode started in the past 7 days. The problem occurs constantly. Pain severity now: 3/10  The problem affects the left side. She is pregnant. Associated symptoms include abdominal pain, nausea and vomiting. Pertinent negatives include no chills, constipation, diarrhea, dysuria, fever, frequency or urgency. The vaginal discharge was normal. There has been no bleeding. Nothing aggravates the symptoms. She has tried nothing for the symptoms. Her menstrual history has been regular (LMP: uncertain ).    Past Medical History:  Diagnosis Date  . Anemia   . Miscarriage   . No pertinent past medical history     Past Surgical History:  Procedure Laterality Date  . dilation and curretage    . INDUCED ABORTION  2010  . SHOULDER SURGERY      Family History  Problem Relation Age of Onset  . Anesthesia problems Neg Hx     Social History  Substance Use Topics  . Smoking status: Never Smoker  . Smokeless tobacco: Never Used  . Alcohol use No    Allergies:  Allergies  Allergen Reactions  . Shrimp [Shellfish Allergy] Other (See Comments)    Throat swells    . Catfish [Fish Allergy] Rash  . Latex Rash    Prescriptions Prior to Admission  Medication Sig Dispense Refill Last Dose  . dicyclomine  (BENTYL) 20 MG tablet Take 1 tablet (20 mg total) by mouth 2 (two) times daily. 20 tablet 0   . omeprazole (PRILOSEC) 20 MG capsule Take 1 capsule (20 mg total) by mouth 2 (two) times daily. 60 capsule 0   . ondansetron (ZOFRAN ODT) 4 MG disintegrating tablet Take 1 tablet (4 mg total) by mouth every 8 (eight) hours as needed for nausea. 6 tablet 0   . Prenatal Vit-Fe Fumarate-FA (PRENATAL MULTIVITAMIN) TABS tablet Take 1 tablet by mouth daily at 12 noon.   04/08/2016 at Unknown time  . traMADol (ULTRAM) 50 MG tablet Take 1 tablet (50 mg total) by mouth every 6 (six) hours as needed. 15 tablet 0   . Ergocalciferol (VITAMIN D2) 400 units TABS Take by mouth.       Review of Systems  Constitutional: Negative for chills and fever.  Gastrointestinal: Positive for abdominal pain, nausea and vomiting. Negative for constipation and diarrhea.  Genitourinary: Positive for pelvic pain. Negative for dysuria, frequency and urgency.   Physical Exam   Blood pressure 114/61, pulse (!) 58, temperature 98.2 F (36.8 C), temperature source Oral, resp. rate 17, height 5\' 7"  (1.702 m), weight 274 lb (124.3 kg), SpO2 100 %.  Physical Exam  MAU Course  Procedures  MDM Patient has had LR with phenergan. She is tolerating PO, and has not vomited since being here.   Assessment and Plan   1. Twin gestation  in first trimester   2. Pelvic pain in pregnancy, antepartum, first trimester   3. Nausea/vomiting in pregnancy   4. [redacted] weeks gestation of pregnancy    DC home Comfort measures reviewed  1st Trimester precautions  RX: phenergan PRN #[id number]  Return to MAU as needed FU with OB as planned  Follow-up Information    [facility] .   Specialty:  Obstetrics and Gynecology Contact information: [address] [place] [address] [phone]           Tawnya Crook 04/08/2016, 11:29 PM

## 2016-04-08 NOTE — MAU Note (Signed)
Pt c/o nausea/vomiting, chills and dizziness x2 days. Has some mild lower abdominal pain-thinks from vomiting. Rates 4-5. States that she has vomited several times today. Cannot keep anything down, including water.

## 2016-04-09 DIAGNOSIS — N949 Unspecified condition associated with female genital organs and menstrual cycle: Secondary | ICD-10-CM

## 2016-04-09 DIAGNOSIS — O219 Vomiting of pregnancy, unspecified: Secondary | ICD-10-CM

## 2016-04-09 DIAGNOSIS — O30001 Twin pregnancy, unspecified number of placenta and unspecified number of amniotic sacs, first trimester: Secondary | ICD-10-CM | POA: Diagnosis not present

## 2016-04-09 DIAGNOSIS — O26891 Other specified pregnancy related conditions, first trimester: Secondary | ICD-10-CM | POA: Diagnosis not present

## 2016-04-09 DIAGNOSIS — Z3A01 Less than 8 weeks gestation of pregnancy: Secondary | ICD-10-CM

## 2016-04-09 LAB — COMPREHENSIVE METABOLIC PANEL
ALT: 9 U/L — ABNORMAL LOW (ref 14–54)
ANION GAP: 7 (ref 5–15)
AST: 16 U/L (ref 15–41)
Albumin: 3.4 g/dL — ABNORMAL LOW (ref 3.5–5.0)
Alkaline Phosphatase: 50 U/L (ref 38–126)
BUN: 7 mg/dL (ref 6–20)
CALCIUM: 8.7 mg/dL — AB (ref 8.9–10.3)
CHLORIDE: 103 mmol/L (ref 101–111)
CO2: 20 mmol/L — AB (ref 22–32)
Creatinine, Ser: 0.64 mg/dL (ref 0.44–1.00)
GFR calc non Af Amer: >60 mL/min (ref >60)
Glucose, Bld: 95 mg/dL (ref 65–99)
Potassium: 3.5 mmol/L (ref 3.5–5.1)
SODIUM: 130 mmol/L — AB (ref 135–145)
Total Bilirubin: 0.8 mg/dL (ref 0.3–1.2)
Total Protein: 7 g/dL (ref 6.5–8.1)

## 2016-04-09 LAB — HCG, QUANTITATIVE, PREGNANCY: HCG, BETA CHAIN, QUANT, S: 72825 m[IU]/mL — AB (ref <5)

## 2016-04-09 MED ORDER — PROMETHAZINE HCL 25 MG PO TABS: 12.5000 mg | ORAL_TABLET | Freq: Four times a day (QID) | ORAL | 0 refills | Status: DC | PRN

## 2016-04-09 NOTE — Discharge Instructions (Signed)

## 2016-05-19 LAB — OB RESULTS CONSOLE GC/CHLAMYDIA
CHLAMYDIA, DNA PROBE: NEGATIVE
GC PROBE AMP, GENITAL: NEGATIVE

## 2016-05-19 LAB — OB RESULTS CONSOLE HEPATITIS B SURFACE ANTIGEN: HEP B S AG: NEGATIVE

## 2016-05-19 LAB — OB RESULTS CONSOLE HIV ANTIBODY (ROUTINE TESTING): HIV: NONREACTIVE

## 2016-05-19 LAB — OB RESULTS CONSOLE RPR: RPR: NONREACTIVE

## 2016-05-19 LAB — OB RESULTS CONSOLE ABO/RH: RH TYPE: POSITIVE

## 2016-05-19 LAB — OB RESULTS CONSOLE RUBELLA ANTIBODY, IGM: Rubella: IMMUNE

## 2016-05-29 ENCOUNTER — Encounter (HOSPITAL_COMMUNITY): Payer: Self-pay

## 2016-05-29 ENCOUNTER — Inpatient Hospital Stay (HOSPITAL_COMMUNITY)
Admission: AD | Admit: 2016-05-29 | Discharge: 2016-05-29 | Disposition: A | Discharge status: Home / Self Care | Payer: BLUE CROSS/BLUE SHIELD | Source: Ambulatory Visit | Attending: Obstetrics and Gynecology | Admitting: Obstetrics and Gynecology

## 2016-05-29 DIAGNOSIS — Z3A14 14 weeks gestation of pregnancy: Secondary | ICD-10-CM | POA: Diagnosis not present

## 2016-05-29 DIAGNOSIS — O219 Vomiting of pregnancy, unspecified: Secondary | ICD-10-CM | POA: Diagnosis present

## 2016-05-29 DIAGNOSIS — O30042 Twin pregnancy, dichorionic/diamniotic, second trimester: Secondary | ICD-10-CM | POA: Insufficient documentation

## 2016-05-29 DIAGNOSIS — A084 Viral intestinal infection, unspecified: Secondary | ICD-10-CM | POA: Diagnosis not present

## 2016-05-29 DIAGNOSIS — Z91040 Latex allergy status: Secondary | ICD-10-CM | POA: Insufficient documentation

## 2016-05-29 DIAGNOSIS — O99612 Diseases of the digestive system complicating pregnancy, second trimester: Secondary | ICD-10-CM | POA: Insufficient documentation

## 2016-05-29 DIAGNOSIS — Z91013 Allergy to seafood: Secondary | ICD-10-CM | POA: Insufficient documentation

## 2016-05-29 LAB — CBC WITH DIFFERENTIAL/PLATELET
BASOS ABS: 0 10*3/uL (ref 0.0–0.1)
BASOS PCT: 0 %
Eosinophils Absolute: 0.1 10*3/uL (ref 0.0–0.7)
Eosinophils Relative: 1 %
HEMATOCRIT: 26.1 % — AB (ref 36.0–46.0)
Hemoglobin: 9 g/dL — ABNORMAL LOW (ref 12.0–15.0)
LYMPHS PCT: 42 %
Lymphs Abs: 1.8 10*3/uL (ref 0.7–4.0)
MCH: 31.6 pg (ref 26.0–34.0)
MCHC: 34.5 g/dL (ref 30.0–36.0)
MCV: 91.6 fL (ref 78.0–100.0)
Monocytes Absolute: 0.3 10*3/uL (ref 0.1–1.0)
Monocytes Relative: 6 %
NEUTROS ABS: 2.1 10*3/uL (ref 1.7–7.7)
Neutrophils Relative %: 51 %
PLATELETS: 180 10*3/uL (ref 150–400)
RBC: 2.85 MIL/uL — AB (ref 3.87–5.11)
RDW: 13.5 % (ref 11.5–15.5)
WBC: 4.2 10*3/uL (ref 4.0–10.5)

## 2016-05-29 LAB — URINALYSIS, ROUTINE W REFLEX MICROSCOPIC
Bilirubin Urine: NEGATIVE
Glucose, UA: NEGATIVE mg/dL
Hgb urine dipstick: NEGATIVE
Ketones, ur: NEGATIVE mg/dL
LEUKOCYTES UA: NEGATIVE
NITRITE: NEGATIVE
PROTEIN: NEGATIVE mg/dL
Specific Gravity, Urine: 1.025 (ref 1.005–1.030)
pH: 6 (ref 5.0–8.0)

## 2016-05-29 LAB — COMPREHENSIVE METABOLIC PANEL
ALBUMIN: 3 g/dL — AB (ref 3.5–5.0)
ALT: 9 U/L — AB (ref 14–54)
AST: 12 U/L — AB (ref 15–41)
Alkaline Phosphatase: 50 U/L (ref 38–126)
Anion gap: 5 (ref 5–15)
BILIRUBIN TOTAL: 0.2 mg/dL — AB (ref 0.3–1.2)
BUN: 5 mg/dL — AB (ref 6–20)
CHLORIDE: 104 mmol/L (ref 101–111)
CO2: 24 mmol/L (ref 22–32)
CREATININE: 0.55 mg/dL (ref 0.44–1.00)
Calcium: 8.7 mg/dL — ABNORMAL LOW (ref 8.9–10.3)
GFR calc Af Amer: >60 mL/min (ref >60)
GLUCOSE: 91 mg/dL (ref 65–99)
Potassium: 3.3 mmol/L — ABNORMAL LOW (ref 3.5–5.1)
Sodium: 133 mmol/L — ABNORMAL LOW (ref 135–145)
Total Protein: 6.7 g/dL (ref 6.5–8.1)

## 2016-05-29 LAB — AMYLASE: AMYLASE: 93 U/L (ref 28–100)

## 2016-05-29 LAB — LIPASE, BLOOD: Lipase: 35 U/L (ref 11–51)

## 2016-05-29 MED ORDER — PROMETHAZINE HCL 25 MG/ML IJ SOLN
25.0000 mg | Freq: Once | INTRAVENOUS | Status: AC
  Administered 2016-05-29: 25 mg via INTRAVENOUS
  Filled 2016-05-29: qty 1

## 2016-05-29 MED ORDER — PROMETHAZINE HCL 25 MG PO TABS: 12.5000 mg | ORAL_TABLET | Freq: Four times a day (QID) | ORAL | 0 refills | Status: DC | PRN

## 2016-05-29 MED ORDER — FAMOTIDINE IN NACL 20-0.9 MG/50ML-% IV SOLN
20.0000 mg | Freq: Once | INTRAVENOUS | Status: AC
  Administered 2016-05-29: 20 mg via INTRAVENOUS
  Filled 2016-05-29: qty 50

## 2016-05-29 NOTE — MAU Note (Addendum)
Patient presents with vomiting x 2 days unable to keep down anything, having intermittent abdominal pain and dizziness. Patient reports having a cold prior to this hurts to take a deep breathe also c/o back pain.

## 2016-05-29 NOTE — Discharge Instructions (Signed)
Food Choices  When you have diarrhea, the foods you eat and your eating habits are very important. Choosing the right foods and drinks can help relieve diarrhea. Also, because diarrhea can last up to 7 days, you need to replace lost fluids and electrolytes (such as sodium, potassium, and chloride) in order to help prevent dehydration. What general guidelines do I need to follow?  Slowly drink 1 cup (8 oz) of fluid for each episode of diarrhea. If you are getting enough fluid, your urine will be clear or pale yellow.  Eat starchy foods. Some good choices include white rice, white toast, pasta, low-fiber cereal, baked potatoes (without the skin), saltine crackers, and bagels.  Avoid large servings of any cooked vegetables.  Limit fruit to two servings per day. A serving is  cup or 1 small piece.  Choose foods with less than 2 g of fiber per serving.  Limit fats to less than 8 tsp (38 g) per day.  Avoid fried foods.  Eat foods that have probiotics in them. Probiotics can be found in certain dairy products.  Avoid foods and beverages that may increase the speed at which food moves through the stomach and intestines (gastrointestinal tract). Things to avoid include:  High-fiber foods, such as dried fruit, raw fruits and vegetables, nuts, seeds, and whole grain foods.  Spicy foods and high-fat foods.  Foods and beverages sweetened with high-fructose corn syrup, honey, or sugar alcohols such as xylitol, sorbitol, and mannitol. What foods are recommended? Grains  White rice. White, [place]French, or pita breads (fresh or toasted), including plain rolls, buns, or bagels. White pasta. Saltine, soda, or graham crackers. Pretzels. Low-fiber cereal. Cooked cereals made with water (such as cornmeal, farina, or cream cereals). Plain muffins. Matzo. Melba toast. Zwieback. Vegetables  Potatoes (without the skin). Strained tomato and vegetable juices. Most well-cooked and canned vegetables without seeds.  Tender lettuce. Fruits  Cooked or canned applesauce, apricots, cherries, fruit cocktail, grapefruit, peaches, pears, or plums. Fresh bananas, apples without skin, cherries, grapes, cantaloupe, grapefruit, peaches, oranges, or plums. Meat and Other Protein Products  Baked or boiled chicken. Eggs. Tofu. Fish. Seafood. Smooth peanut butter. Ground or well-cooked tender beef, ham, veal, lamb, pork, or poultry. Dairy  Plain yogurt, kefir, and unsweetened liquid yogurt. Lactose-free milk, buttermilk, or soy milk. Plain hard cheese. Beverages  Sport drinks. Clear broths. Diluted fruit juices (except prune). Regular, caffeine-free sodas such as ginger ale. Water. Decaffeinated teas. Oral rehydration solutions. Sugar-free beverages not sweetened with sugar alcohols. Other  Bouillon, broth, or soups made from recommended foods. The items listed above may not be a complete list of recommended foods or beverages. Contact your dietitian for more options.  What foods are not recommended? Grains  Whole grain, whole wheat, bran, or rye breads, rolls, pastas, crackers, and cereals. Wild or brown rice. Cereals that contain more than 2 g of fiber per serving. Corn tortillas or taco shells. Cooked or dry oatmeal. Granola. Popcorn. Vegetables  Raw vegetables. Cabbage, broccoli, Brussels sprouts, artichokes, baked beans, beet greens, corn, kale, legumes, peas, sweet potatoes, and yams. Potato skins. Cooked spinach and cabbage. Fruits  Dried fruit, including raisins and dates. Raw fruits. Stewed or dried prunes. Fresh apples with skin, apricots, mangoes, pears, raspberries, and strawberries. Meat and Other Protein Products  Chunky peanut butter. Nuts and seeds. Beans and lentils. Tomasa BlaseBacon. Dairy  High-fat cheeses. Milk, chocolate milk, and beverages made with milk, such as milk shakes. Cream. Ice cream. Sweets and Desserts  Sweet rolls, doughnuts,  and sweet breads. Pancakes and waffles. Fats and Oils  Butter. Cream  sauces. Margarine. Salad oils. Plain salad dressings. Olives. Avocados. Beverages  Caffeinated beverages (such as coffee, tea, soda, or energy drinks). Alcoholic beverages. Fruit juices with pulp. Prune juice. Soft drinks sweetened with high-fructose corn syrup or sugar alcohols. Other  Coconut. Hot sauce. Chili powder. Mayonnaise. Gravy. Cream-based or milk-based soups. The items listed above may not be a complete list of foods and beverages to avoid. Contact your dietitian for more information.  What should I do if I become dehydrated? Diarrhea can sometimes lead to dehydration. Signs of dehydration include dark urine and dry mouth and skin. If you think you are dehydrated, you should rehydrate with an oral rehydration solution. These solutions can be purchased at pharmacies, retail stores, or online. Drink -1 cup (120-240 mL) of oral rehydration solution each time you have an episode of diarrhea. If drinking this amount makes your diarrhea worse, try drinking smaller amounts more often. For example, drink 1-3 tsp (5-15 mL) every 5-10 minutes. A general rule for staying hydrated is to drink 1-2 L of fluid per day. Talk to your health care provider about the specific amount you should be drinking each day. Drink enough fluids to keep your urine clear or pale yellow. This information is not intended to replace advice given to you by your health care provider. Make sure you discuss any questions you have with your health care provider. Document Released: 09/19/2003 Document Revised: 12/05/2015 Document Reviewed: 05/22/2013 Elsevier Interactive Patient Education  2017 [employer]

## 2016-05-29 NOTE — MAU Provider Note (Signed)
History     CSN: [id number]  Arrival date and time: 05/29/16 1539   First Provider Initiated Contact with Patient 05/29/16 1602      Chief Complaint  Patient presents with  . Emesis During Pregnancy  . Abdominal Pain  . Dizziness   HPI Ms. Susan Zhang is a 30 y.o. G1P0000 at [redacted]w[redacted]d with DiDi twins who presents to MAU today with complaint of N/V x 2 days. The patient states N/V earlier in the pregnancy resolved around 6 weeks. She states fever the last 2 nights of 103 F at home, resolved with Tylenol. She denies diarrhea, sick contacts, vaginal bleeding or lower abdominal pain. She endorses associated upper abdominal pain bilaterally, dizziness, headache and aching in her upper back.   OB History    Gravida Para Term Preterm AB Living   1 0 0 0 0 0   SAB TAB Ectopic Multiple Live Births   0 0 0 0        Past Medical History:  Diagnosis Date  . Anemia   . Miscarriage   . No pertinent past medical history     Past Surgical History:  Procedure Laterality Date  . dilation and curretage    . INDUCED ABORTION  2010  . SHOULDER SURGERY      Family History  Problem Relation Age of Onset  . Anesthesia problems Neg Hx     Social History  Substance Use Topics  . Smoking status: Never Smoker  . Smokeless tobacco: Never Used  . Alcohol use No    Allergies:  Allergies  Allergen Reactions  . Shrimp [Shellfish Allergy] Other (See Comments)    Throat swells    . Catfish [Fish Allergy] Rash  . Latex Rash    Prescriptions Prior to Admission  Medication Sig Dispense Refill Last Dose  . Prenatal Vit-Fe Fumarate-FA (PRENATAL MULTIVITAMIN) TABS tablet Take 1 tablet by mouth daily at 12 noon.   05/29/2016 at Unknown time  . omeprazole (PRILOSEC) 20 MG capsule Take 1 capsule (20 mg total) by mouth 2 (two) times daily. (Patient not taking: Reported on 05/29/2016) 60 capsule 0 Not Taking at Unknown time  . promethazine (PHENERGAN) 25 MG tablet Take 0.5-1 tablets (12.5-25 mg  total) by mouth every 6 (six) hours as needed. (Patient not taking: Reported on 05/29/2016) 30 tablet 0 Not Taking at Unknown time  . traMADol (ULTRAM) 50 MG tablet Take 1 tablet (50 mg total) by mouth every 6 (six) hours as needed. (Patient not taking: Reported on 05/29/2016) 15 tablet 0 Not Taking at Unknown time    Review of Systems  Constitutional: Positive for chills, fever and malaise/fatigue.  Gastrointestinal: Positive for abdominal pain, nausea and vomiting. Negative for constipation and diarrhea.  Genitourinary:       Neg - vaginal bleeding  Musculoskeletal: Positive for myalgias.  Neurological: Positive for weakness.   Physical Exam   Blood pressure 128/68, temperature 98.2 F (36.8 C), temperature source Oral, resp. rate 18, height 5\' 7"  (1.702 m), weight 273 lb (123.8 kg).  Physical Exam  Nursing note and vitals reviewed. Constitutional: She is oriented to person, place, and time. She appears well-developed and well-nourished. No distress.  HENT:  Head: Normocephalic and atraumatic.  Cardiovascular: Normal rate.   Respiratory: Effort normal.  GI: Soft. She exhibits no distension and no mass. There is no tenderness. There is no rebound and no guarding.  Neurological: She is alert and oriented to person, place, and time.  Skin: Skin is warm  and dry. No erythema.  Psychiatric: She has a normal mood and affect.    Results for orders placed or performed during the hospital encounter of 05/29/16 (from the past 24 hour(s))  Urinalysis, Routine w reflex microscopic (not at [facility])     Status: None   Collection Time: 05/29/16  3:45 PM  Result Value Ref Range   Color, Urine YELLOW YELLOW   APPearance CLEAR CLEAR   Specific Gravity, Urine 1.025 1.005 - 1.030   pH 6.0 5.0 - 8.0   Glucose, UA NEGATIVE NEGATIVE mg/dL   Hgb urine dipstick NEGATIVE NEGATIVE   Bilirubin Urine NEGATIVE NEGATIVE   Ketones, ur NEGATIVE NEGATIVE mg/dL   Protein, ur NEGATIVE NEGATIVE mg/dL   Nitrite  NEGATIVE NEGATIVE   Leukocytes, UA NEGATIVE NEGATIVE  CBC with Differential/Platelet     Status: Abnormal   Collection Time: 05/29/16  4:35 PM  Result Value Ref Range   WBC 4.2 4.0 - 10.5 K/uL   RBC 2.85 (L) 3.87 - 5.11 MIL/uL   Hemoglobin 9.0 (L) 12.0 - 15.0 g/dL   HCT 16.126.1 (L) 09.636.0 - 04.546.0 %   MCV 91.6 78.0 - 100.0 fL   MCH 31.6 26.0 - 34.0 pg   MCHC 34.5 30.0 - 36.0 g/dL   RDW 40.913.5 81.111.5 - 91.415.5 %   Platelets 180 150 - 400 K/uL   Neutrophils Relative % 51 %   Neutro Abs 2.1 1.7 - 7.7 K/uL   Lymphocytes Relative 42 %   Lymphs Abs 1.8 0.7 - 4.0 K/uL   Monocytes Relative 6 %   Monocytes Absolute 0.3 0.1 - 1.0 K/uL   Eosinophils Relative 1 %   Eosinophils Absolute 0.1 0.0 - 0.7 K/uL   Basophils Relative 0 %   Basophils Absolute 0.0 0.0 - 0.1 K/uL  Comprehensive metabolic panel     Status: Abnormal   Collection Time: 05/29/16  4:35 PM  Result Value Ref Range   Sodium 133 (L) 135 - 145 mmol/L   Potassium 3.3 (L) 3.5 - 5.1 mmol/L   Chloride 104 101 - 111 mmol/L   CO2 24 22 - 32 mmol/L   Glucose, Bld 91 65 - 99 mg/dL   BUN 5 (L) 6 - 20 mg/dL   Creatinine, Ser 7.820.55 0.44 - 1.00 mg/dL   Calcium 8.7 (L) 8.9 - 10.3 mg/dL   Total Protein 6.7 6.5 - 8.1 g/dL   Albumin 3.0 (L) 3.5 - 5.0 g/dL   AST 12 (L) 15 - 41 U/L   ALT 9 (L) 14 - 54 U/L   Alkaline Phosphatase 50 38 - 126 U/L   Total Bilirubin 0.2 (L) 0.3 - 1.2 mg/dL   GFR calc non Af Amer >60 >60 mL/min   GFR calc Af Amer >60 >60 mL/min   Anion gap 5 5 - 15  Amylase     Status: None   Collection Time: 05/29/16  4:35 PM  Result Value Ref Range   Amylase 93 28 - 100 U/L  Lipase, blood     Status: None   Collection Time: 05/29/16  4:35 PM  Result Value Ref Range   Lipase 35 11 - 51 U/L    MAU Course  Procedures None  MDM FHR - 147 bpm and 152 bpm with doppler UA today without evidence of dehydration CBC, CMP, Amylase and Lipase today without gross abnormalities Patient able to tolerate PO today while in MAU. No emesis in  MAU today.  Discussed patient with Dr. Mindi SlickerBanga. Patient ok for  discharge with Rx for Phenergan.   Assessment and Plan  A: Twin IUP at 2216w0d  Viral gastroenteritis  P: Discharge home Rx for Phenergan given to patient  Second trimester precautions and warning signs for worsening condition discussed Patient advised to [facility] as scheduled for routine prenatal care or sooner PRN Patient may return to MAU as needed or if her condition were to change or worsen   Marny LowensteinJulie N Kymberlie Brazeau, PA-C  05/29/2016, 6:12 PM

## 2016-07-13 NOTE — L&D Delivery Note (Signed)
  Susan Zhang [[id number]]  Delivery Note Pushed x 2 for delivery.  At 4:49 AM Susan viable and healthy female was delivered via Vaginal, Spontaneous Delivery (Presentation: OA; LOT  ).  APGAR: 8, 9; weight 4 lb 6.7 oz (2005 g).   Placenta status: delivered, intact .  Cord: 3V with the following complications: none.  Anesthesia:  epidural Episiotomy: None Lacerations: abrasion R labial Suture Repair: N/Susan Est. Blood Loss (mL): 200  Mom to postpartum.  Baby to Couplet care / Skin to Skin.  Susan Zhang 10/25/2016, 6:14 AM     Susan Zhang, Susan Zhang [[id number]]  Delivery Note AROM and push x 2 for delivery.  At 4:58 AM Susan viable and healthy female was delivered via Vaginal, Spontaneous Delivery (Presentation: OA ).  APGAR: 8, 8; weight 4 lb 11 oz (2126 g).   Placenta status: delivered, intact .  Cord: 3V  with the following complications:  none.    Anesthesia:  epidural Episiotomy: None Lacerations: R labial abrasion Suture Repair: N/Susan Est. Blood Loss (mL): 200cc  Mom to postpartum.  Baby to Couplet care / Skin to Skin.  Susan Zhang 10/25/2016, 6:14 AM  Susan Zhang

## 2016-07-18 ENCOUNTER — Inpatient Hospital Stay (HOSPITAL_COMMUNITY)
Admission: AD | Admit: 2016-07-18 | Discharge: 2016-07-18 | Disposition: A | Discharge status: Home / Self Care | Payer: BLUE CROSS/BLUE SHIELD | Source: Ambulatory Visit | Attending: Obstetrics and Gynecology | Admitting: Obstetrics and Gynecology

## 2016-07-18 ENCOUNTER — Encounter (HOSPITAL_COMMUNITY): Payer: Self-pay | Admitting: *Deleted

## 2016-07-18 DIAGNOSIS — R109 Unspecified abdominal pain: Secondary | ICD-10-CM | POA: Insufficient documentation

## 2016-07-18 DIAGNOSIS — R42 Dizziness and giddiness: Secondary | ICD-10-CM | POA: Insufficient documentation

## 2016-07-18 DIAGNOSIS — Z91013 Allergy to seafood: Secondary | ICD-10-CM | POA: Insufficient documentation

## 2016-07-18 DIAGNOSIS — O26892 Other specified pregnancy related conditions, second trimester: Secondary | ICD-10-CM

## 2016-07-18 DIAGNOSIS — O30002 Twin pregnancy, unspecified number of placenta and unspecified number of amniotic sacs, second trimester: Secondary | ICD-10-CM | POA: Insufficient documentation

## 2016-07-18 DIAGNOSIS — Z91040 Latex allergy status: Secondary | ICD-10-CM | POA: Insufficient documentation

## 2016-07-18 DIAGNOSIS — R110 Nausea: Secondary | ICD-10-CM | POA: Insufficient documentation

## 2016-07-18 DIAGNOSIS — R197 Diarrhea, unspecified: Secondary | ICD-10-CM

## 2016-07-18 DIAGNOSIS — A09 Infectious gastroenteritis and colitis, unspecified: Secondary | ICD-10-CM

## 2016-07-18 DIAGNOSIS — Z3A21 21 weeks gestation of pregnancy: Secondary | ICD-10-CM | POA: Insufficient documentation

## 2016-07-18 LAB — URINALYSIS, ROUTINE W REFLEX MICROSCOPIC
BILIRUBIN URINE: NEGATIVE
Glucose, UA: NEGATIVE mg/dL
KETONES UR: NEGATIVE mg/dL
LEUKOCYTES UA: NEGATIVE
NITRITE: NEGATIVE
Protein, ur: NEGATIVE mg/dL
SPECIFIC GRAVITY, URINE: 1.004 — AB (ref 1.005–1.030)
pH: 6 (ref 5.0–8.0)

## 2016-07-18 MED ORDER — ONDANSETRON 8 MG PO TBDP
8.0000 mg | ORAL_TABLET | Freq: Once | ORAL | Status: AC
  Administered 2016-07-18: 8 mg via ORAL
  Filled 2016-07-18: qty 1

## 2016-07-18 MED ORDER — ONDANSETRON 8 MG PO TBDP: 8.0000 mg | ORAL_TABLET | Freq: Three times a day (TID) | ORAL | 0 refills | Status: DC | PRN

## 2016-07-18 NOTE — MAU Note (Signed)
Diarrhea onset 2 days ago, pain in stomach and back, dizziness, feels dehydrated.

## 2016-07-18 NOTE — Discharge Instructions (Signed)

## 2016-07-18 NOTE — MAU Provider Note (Signed)
History   G1 @ 21.1 wks with twins in with c/o abd pain, diarrhea and nausea since yesterday. Denies vag bleeding or ROM  CSN: [id number]  Arrival date & time 07/18/16  1811   None     Chief Complaint  Patient presents with  . Abdominal Pain  . Dizziness  . Diarrhea  . Chills    HPI  Past Medical History:  Diagnosis Date  . Anemia   . Miscarriage   . No pertinent past medical history     Past Surgical History:  Procedure Laterality Date  . dilation and curretage    . INDUCED ABORTION  2010  . SHOULDER SURGERY      Family History  Problem Relation Age of Onset  . Anesthesia problems Neg Hx     Social History  Substance Use Topics  . Smoking status: Never Smoker  . Smokeless tobacco: Never Used  . Alcohol use No    OB History    Gravida Para Term Preterm AB Living   1 0 0 0 0 0   SAB TAB Ectopic Multiple Live Births   0 0 0 0        Review of Systems  Constitutional: Negative.   HENT: Negative.   Eyes: Negative.   Respiratory: Negative.   Cardiovascular: Negative.   Gastrointestinal: Positive for abdominal pain, diarrhea and nausea.  Endocrine: Negative.   Genitourinary: Negative.   Musculoskeletal: Negative.   Skin: Negative.   Allergic/Immunologic: Negative.   Neurological: Negative.   Hematological: Negative.   Psychiatric/Behavioral: Negative.     Allergies  Shrimp [shellfish allergy]; Catfish [fish allergy]; and Latex  Home Medications    BP 116/81 (BP Location: Right Arm)   Pulse 90   Temp 98.6 F (37 C) (Oral)   Resp 18   Ht 5\' 6"  (1.676 m)   Wt 280 lb 1 oz (127 kg)   BMI 45.20 kg/m   Physical Exam  Constitutional: She is oriented to person, place, and time. She appears well-developed and well-nourished.  HENT:  Head: Normocephalic.  Eyes: Pupils are equal, round, and reactive to light.  Neck: Normal range of motion.  Cardiovascular: Normal rate, regular rhythm, normal heart sounds and intact distal pulses.    Pulmonary/Chest: Effort normal and breath sounds normal.  Abdominal: Soft. Bowel sounds are normal.  Genitourinary: Uterus normal.  Musculoskeletal: Normal range of motion.  Neurological: She is alert and oriented to person, place, and time. She has normal reflexes.  Skin: Skin is warm and dry.  Psychiatric: She has a normal mood and affect. Her behavior is normal. Judgment and thought content normal.    MAU Course  Procedures (including critical care time)  Labs Reviewed  URINALYSIS, ROUTINE W REFLEX MICROSCOPIC   No results found.   No diagnosis found.    MDM  SVE firm/cl/post/high. VSS, FHR st and reg x 2 per doppler. Will give zofran for nausea and diarrhea and consult Dr. Jackelyn KnifeMeisinger. Pt to be d/c home

## 2016-08-04 ENCOUNTER — Inpatient Hospital Stay (HOSPITAL_COMMUNITY)
Admission: AD | Admit: 2016-08-04 | Discharge: 2016-08-04 | Disposition: A | Discharge status: Home / Self Care | Payer: BLUE CROSS/BLUE SHIELD | Source: Ambulatory Visit | Attending: Obstetrics and Gynecology | Admitting: Obstetrics and Gynecology

## 2016-08-04 DIAGNOSIS — Z3A24 24 weeks gestation of pregnancy: Secondary | ICD-10-CM | POA: Insufficient documentation

## 2016-08-04 DIAGNOSIS — O30002 Twin pregnancy, unspecified number of placenta and unspecified number of amniotic sacs, second trimester: Secondary | ICD-10-CM | POA: Insufficient documentation

## 2016-08-04 DIAGNOSIS — O26872 Cervical shortening, second trimester: Secondary | ICD-10-CM | POA: Insufficient documentation

## 2016-08-04 MED ORDER — BETAMETHASONE SOD PHOS & ACET 6 (3-3) MG/ML IJ SUSP
12.0000 mg | Freq: Once | INTRAMUSCULAR | Status: AC
  Administered 2016-08-04: 12 mg via INTRAMUSCULAR
  Filled 2016-08-04: qty 2

## 2016-08-04 MED ORDER — BETAMETHASONE SOD PHOS & ACET 6 (3-3) MG/ML IJ SUSP: 12.0000 mg | Freq: Once | INTRAMUSCULAR | Status: DC

## 2016-08-04 NOTE — MAU Note (Signed)
Pt states thought she was here for a shot, fluids and monitoring,   Twins. Shortening cervix (call into office again)

## 2016-08-04 NOTE — MAU Note (Signed)
Sent for Betamethasone injectons

## 2016-08-05 ENCOUNTER — Inpatient Hospital Stay (HOSPITAL_COMMUNITY)
Admission: AD | Admit: 2016-08-05 | Discharge: 2016-08-05 | Disposition: A | Discharge status: Home / Self Care | Payer: BLUE CROSS/BLUE SHIELD | Source: Ambulatory Visit | Attending: Obstetrics and Gynecology | Admitting: Obstetrics and Gynecology

## 2016-08-05 DIAGNOSIS — Z3A00 Weeks of gestation of pregnancy not specified: Secondary | ICD-10-CM | POA: Diagnosis not present

## 2016-08-05 DIAGNOSIS — O26872 Cervical shortening, second trimester: Secondary | ICD-10-CM | POA: Diagnosis present

## 2016-08-05 MED ORDER — BETAMETHASONE SOD PHOS & ACET 6 (3-3) MG/ML IJ SUSP
12.0000 mg | Freq: Once | INTRAMUSCULAR | Status: AC
  Administered 2016-08-05: 12 mg via INTRAMUSCULAR
  Filled 2016-08-05: qty 2

## 2016-08-16 ENCOUNTER — Encounter (HOSPITAL_COMMUNITY): Payer: Self-pay | Admitting: *Deleted

## 2016-08-16 ENCOUNTER — Inpatient Hospital Stay (HOSPITAL_COMMUNITY)
Admission: AD | Admit: 2016-08-16 | Discharge: 2016-08-16 | Disposition: A | Discharge status: Home / Self Care | Payer: BLUE CROSS/BLUE SHIELD | Source: Ambulatory Visit | Attending: Obstetrics and Gynecology | Admitting: Obstetrics and Gynecology

## 2016-08-16 DIAGNOSIS — Z91040 Latex allergy status: Secondary | ICD-10-CM | POA: Insufficient documentation

## 2016-08-16 DIAGNOSIS — Z91013 Allergy to seafood: Secondary | ICD-10-CM | POA: Diagnosis not present

## 2016-08-16 DIAGNOSIS — R109 Unspecified abdominal pain: Secondary | ICD-10-CM | POA: Diagnosis present

## 2016-08-16 DIAGNOSIS — O479 False labor, unspecified: Secondary | ICD-10-CM

## 2016-08-16 DIAGNOSIS — R42 Dizziness and giddiness: Secondary | ICD-10-CM | POA: Diagnosis not present

## 2016-08-16 DIAGNOSIS — O26892 Other specified pregnancy related conditions, second trimester: Secondary | ICD-10-CM | POA: Diagnosis not present

## 2016-08-16 DIAGNOSIS — Z3A25 25 weeks gestation of pregnancy: Secondary | ICD-10-CM | POA: Diagnosis not present

## 2016-08-16 DIAGNOSIS — O30002 Twin pregnancy, unspecified number of placenta and unspecified number of amniotic sacs, second trimester: Secondary | ICD-10-CM | POA: Insufficient documentation

## 2016-08-16 DIAGNOSIS — O4702 False labor before 37 completed weeks of gestation, second trimester: Secondary | ICD-10-CM

## 2016-08-16 LAB — URINALYSIS, ROUTINE W REFLEX MICROSCOPIC
BILIRUBIN URINE: NEGATIVE
GLUCOSE, UA: NEGATIVE mg/dL
Hgb urine dipstick: NEGATIVE
KETONES UR: NEGATIVE mg/dL
LEUKOCYTES UA: NEGATIVE
Nitrite: NEGATIVE
PH: 6 (ref 5.0–8.0)
Protein, ur: NEGATIVE mg/dL
Specific Gravity, Urine: 1.004 — ABNORMAL LOW (ref 1.005–1.030)

## 2016-08-16 LAB — CBC
HCT: 28.9 % — ABNORMAL LOW (ref 36.0–46.0)
HEMOGLOBIN: 9.5 g/dL — AB (ref 12.0–15.0)
MCH: 31.3 pg (ref 26.0–34.0)
MCHC: 32.9 g/dL (ref 30.0–36.0)
MCV: 95.1 fL (ref 78.0–100.0)
Platelets: 183 10*3/uL (ref 150–400)
RBC: 3.04 MIL/uL — ABNORMAL LOW (ref 3.87–5.11)
RDW: 12.8 % (ref 11.5–15.5)
WBC: 6.9 10*3/uL (ref 4.0–10.5)

## 2016-08-16 MED ORDER — NIFEDIPINE 10 MG PO CAPS
10.0000 mg | ORAL_CAPSULE | ORAL | Status: AC | PRN
  Administered 2016-08-16 (×3): 10 mg via ORAL
  Filled 2016-08-16 (×3): qty 1

## 2016-08-16 NOTE — MAU Note (Signed)
Pt stated she has been having ctx.Now more than 5 hour. Denies any vag discharge or bleeding. Good fetal movement reported.Pt stated she is also feeling dizzy.

## 2016-08-16 NOTE — MAU Provider Note (Signed)
History     CSN: [id number]  Arrival date and time: 08/16/16 0003   First Provider Initiated Contact with Patient 08/16/16 0112      Chief Complaint  Patient presents with  . Contractions   HPI   Ms.Susan Zhang is a 31 y.o. female G1P0000 twin gestation  @ 3262w2d here in MAU with contractions. 7 contraction in an hour; not painful just tightening of her abdomen. Occasional menstrual like  Denies vaginal bleeding. + fetal movement. She reports dizziness which she has been experiencing throughout the pregnancy.   Reviewed prenatal record which shows the following: 1/23: 1.4 cm cervical length, betamethasone given  1/26: positive FFN 1/30: CL 0.98 cm.  OB History    Gravida Para Term Preterm AB Living   1 0 0 0 0 0   SAB TAB Ectopic Multiple Live Births   0 0 0 0        Past Medical History:  Diagnosis Date  . Anemia   . Miscarriage   . No pertinent past medical history     Past Surgical History:  Procedure Laterality Date  . dilation and curretage    . INDUCED ABORTION  2010  . SHOULDER SURGERY      Family History  Problem Relation Age of Onset  . Anesthesia problems Neg Hx     Social History  Substance Use Topics  . Smoking status: Never Smoker  . Smokeless tobacco: Never Used  . Alcohol use No    Allergies:  Allergies  Allergen Reactions  . Shrimp [Shellfish Allergy] Other (See Comments)    Throat swells    . Catfish [Fish Allergy] Rash  . Latex Rash    Prescriptions Prior to Admission  Medication Sig Dispense Refill Last Dose  . omeprazole (PRILOSEC) 20 MG capsule Take 1 capsule (20 mg total) by mouth 2 (two) times daily. (Patient not taking: Reported on 05/29/2016) 60 capsule 0 Not Taking at Unknown time  . ondansetron (ZOFRAN ODT) 8 MG disintegrating tablet Take 1 tablet (8 mg total) by mouth every 8 (eight) hours as needed for nausea or vomiting. 20 tablet 0   . Prenatal Vit-Fe Fumarate-FA (PRENATAL MULTIVITAMIN) TABS tablet Take 1 tablet by  mouth daily at 12 noon.   05/29/2016 at Unknown time  . promethazine (PHENERGAN) 25 MG tablet Take 0.5-1 tablets (12.5-25 mg total) by mouth every 6 (six) hours as needed. 30 tablet 0    Results for orders placed or performed during the hospital encounter of 08/16/16 (from the past 48 hour(s))  Urinalysis, Routine w reflex microscopic     Status: Abnormal   Collection Time: 08/16/16 12:30 AM  Result Value Ref Range   Color, Urine STRAW (A) YELLOW   APPearance CLEAR CLEAR   Specific Gravity, Urine 1.004 (L) 1.005 - 1.030   pH 6.0 5.0 - 8.0   Glucose, UA NEGATIVE NEGATIVE mg/dL   Hgb urine dipstick NEGATIVE NEGATIVE   Bilirubin Urine NEGATIVE NEGATIVE   Ketones, ur NEGATIVE NEGATIVE mg/dL   Protein, ur NEGATIVE NEGATIVE mg/dL   Nitrite NEGATIVE NEGATIVE   Leukocytes, UA NEGATIVE NEGATIVE  CBC     Status: Abnormal   Collection Time: 08/16/16  1:34 AM  Result Value Ref Range   WBC 6.9 4.0 - 10.5 K/uL   RBC 3.04 (L) 3.87 - 5.11 MIL/uL   Hemoglobin 9.5 (L) 12.0 - 15.0 g/dL   HCT 40.928.9 (L) 81.136.0 - 91.446.0 %   MCV 95.1 78.0 - 100.0 fL   MCH  31.3 26.0 - 34.0 pg   MCHC 32.9 30.0 - 36.0 g/dL   RDW 84.6 96.2 - 95.2 %   Platelets 183 150 - 400 K/uL    Review of Systems  Gastrointestinal: Positive for abdominal pain.   Physical Exam   Blood pressure 131/81, pulse 89, temperature 98.3 F (36.8 C), temperature source Axillary, resp. rate 18, height 5\' 7"  (1.702 m), weight 272 lb (123.4 kg), SpO2 98 %.  Physical Exam  Constitutional: She is oriented to person, place, and time. She appears well-developed and well-nourished. No distress.  HENT:  Head: Normocephalic.  Respiratory: Effort normal.  GI: Soft. She exhibits no distension. There is no tenderness. There is no rebound.  Genitourinary:  Genitourinary Comments:  Cervix, Closed, middle, cervix felt thin, -3  Musculoskeletal: Normal range of motion.  Neurological: She is alert and oriented to person, place, and time.  Skin: Skin is  warm. She is not diaphoretic.  Psychiatric: Her behavior is normal.   Fetal Tracing A: Baseline: 140 bpm  Variability: Moderate  Accelerations: 15x15 Decelerations: none Toco: none  Fetal Tracing B: Baseline: 135 bpm  Variability: Moderate  Accelerations: 10x10 Decelerations: none   MAU Course  Procedures  None  MDM  Discussed patient with Dr. Jackelyn Knife at [phone]. Discussed [place] results and CL. Ok to DC home.  -Will try procardia and DC with reassurance.  -Patient feeling better following procardia.   Assessment and Plan   A:  1. Threatened preterm labor, second trimester   2. Braxton Hicks contractions      P:  Discharge home in stable condition Strict return precautions Follow up with OB as scheduled, or sooner if needed Preterm labor precautions  Duane Lope, NP 08/17/2016 5:37 PM

## 2016-09-27 ENCOUNTER — Inpatient Hospital Stay (HOSPITAL_COMMUNITY)
Admission: AD | Admit: 2016-09-27 | Discharge: 2016-09-28 | Disposition: A | Discharge status: Home / Self Care | Payer: BLUE CROSS/BLUE SHIELD | Source: Ambulatory Visit | Attending: Obstetrics and Gynecology | Admitting: Obstetrics and Gynecology

## 2016-09-27 DIAGNOSIS — Z3A31 31 weeks gestation of pregnancy: Secondary | ICD-10-CM | POA: Insufficient documentation

## 2016-09-27 DIAGNOSIS — Z91013 Allergy to seafood: Secondary | ICD-10-CM | POA: Insufficient documentation

## 2016-09-27 DIAGNOSIS — O4703 False labor before 37 completed weeks of gestation, third trimester: Secondary | ICD-10-CM | POA: Diagnosis not present

## 2016-09-27 DIAGNOSIS — O26873 Cervical shortening, third trimester: Secondary | ICD-10-CM | POA: Diagnosis not present

## 2016-09-27 DIAGNOSIS — O479 False labor, unspecified: Secondary | ICD-10-CM

## 2016-09-27 DIAGNOSIS — R102 Pelvic and perineal pain: Secondary | ICD-10-CM | POA: Diagnosis present

## 2016-09-27 DIAGNOSIS — Z91040 Latex allergy status: Secondary | ICD-10-CM | POA: Insufficient documentation

## 2016-09-27 NOTE — MAU Note (Signed)
Pt presents complaining of contractions every few minutes for the last hour. Unsure how often. Denies leaking or bleeding. Reports good fetal movement.

## 2016-09-28 ENCOUNTER — Encounter (HOSPITAL_COMMUNITY): Payer: Self-pay

## 2016-09-28 DIAGNOSIS — O479 False labor, unspecified: Secondary | ICD-10-CM | POA: Diagnosis not present

## 2016-09-28 LAB — URINALYSIS, ROUTINE W REFLEX MICROSCOPIC
Bilirubin Urine: NEGATIVE
GLUCOSE, UA: NEGATIVE mg/dL
HGB URINE DIPSTICK: NEGATIVE
KETONES UR: NEGATIVE mg/dL
Leukocytes, UA: NEGATIVE
Nitrite: NEGATIVE
PH: 6 (ref 5.0–8.0)
PROTEIN: NEGATIVE mg/dL
Specific Gravity, Urine: <1.005 — ABNORMAL LOW (ref 1.005–1.030)

## 2016-09-28 MED ORDER — TERBUTALINE SULFATE 1 MG/ML IJ SOLN
0.2500 mg | Freq: Once | INTRAMUSCULAR | Status: AC
  Administered 2016-09-28: 0.25 mg via SUBCUTANEOUS
  Filled 2016-09-28: qty 1

## 2016-09-28 NOTE — MAU Provider Note (Signed)
History   [id number]   Chief Complaint  Patient presents with  . Contractions    HPI Susan Zhang is a 31 y.o. female  G1P0000 at 7772w3d IUP here with report of contractions that started yesterday.  Increased in frequency tonight.  No report of leaking of fluid or vaginal bleeding.  Short cervix with this pregnancy and using progesterone nightly.    No LMP recorded. Patient is pregnant.  OB History  Gravida Para Term Preterm AB Living  1 0 0 0 0 0  SAB TAB Ectopic Multiple Live Births  0 0 0 0      # Outcome Date GA Lbr Len/2nd Weight Sex Delivery Anes PTL Lv  1 Current               Past Medical History:  Diagnosis Date  . Anemia   . Miscarriage   . No pertinent past medical history     Family History  Problem Relation Age of Onset  . Anesthesia problems Neg Hx     Social History   Social History  . Marital status: Married    Spouse name: N/A  . Number of children: N/A  . Years of education: N/A   Social History Main Topics  . Smoking status: Never Smoker  . Smokeless tobacco: Never Used  . Alcohol use No  . Drug use: No  . Sexual activity: Yes    Birth control/ protection: None   Other Topics Concern  . None   Social History Narrative  . None    Allergies  Allergen Reactions  . Shrimp [Shellfish Allergy] Other (See Comments)    Throat swells    . Catfish [Fish Allergy] Rash  . Latex Rash    No current facility-administered medications on file prior to encounter.    Current Outpatient Prescriptions on File Prior to Encounter  Medication Sig Dispense Refill  . omeprazole (PRILOSEC) 20 MG capsule Take 1 capsule (20 mg total) by mouth 2 (two) times daily. (Patient not taking: Reported on 05/29/2016) 60 capsule 0  . ondansetron (ZOFRAN ODT) 8 MG disintegrating tablet Take 1 tablet (8 mg total) by mouth every 8 (eight) hours as needed for nausea or vomiting. 20 tablet 0  . Prenatal Vit-Fe Fumarate-FA (PRENATAL MULTIVITAMIN) TABS tablet Take 1  tablet by mouth daily at 12 noon.    . promethazine (PHENERGAN) 25 MG tablet Take 0.5-1 tablets (12.5-25 mg total) by mouth every 6 (six) hours as needed. 30 tablet 0     Review of Systems  Constitutional: Negative for fever.  Genitourinary: Positive for pelvic pain (contractions). Negative for dysuria, flank pain, vaginal bleeding and vaginal discharge.  Neurological: Negative for headaches.  All other systems reviewed and are negative.    Physical Exam   Vitals:   09/27/16 2355  BP: 103/71  Pulse: 87  Resp: 18  Temp: 98 F (36.7 C)  TempSrc: Oral    Physical Exam  Constitutional: She is oriented to person, place, and time. She appears well-developed and well-nourished.  HENT:  Head: Normocephalic.  Neck: Normal range of motion. Neck supple.  Cardiovascular: Normal rate, regular rhythm and normal heart sounds.   Respiratory: Effort normal and breath sounds normal. No respiratory distress.  GI: Soft. There is no tenderness.  Genitourinary: No bleeding in the vagina. Vaginal discharge (mucusy) found.  Musculoskeletal: Normal range of motion. She exhibits no edema.  Neurological: She is alert and oriented to person, place, and time.  Skin: Skin is warm and  dry.   Dilation: 1 Exam by:: Rochele Pages CNM   MAU Course  Procedures  MDM Results for orders placed or performed during the hospital encounter of 09/27/16 (from the past 24 hour(s))  Urinalysis, Routine w reflex microscopic     Status: Abnormal   Collection Time: 09/27/16 11:36 PM  Result Value Ref Range   Color, Urine YELLOW YELLOW   APPearance CLEAR CLEAR   Specific Gravity, Urine <1.005 (L) 1.005 - 1.030   pH 6.0 5.0 - 8.0   Glucose, UA NEGATIVE NEGATIVE mg/dL   Hgb urine dipstick NEGATIVE NEGATIVE   Bilirubin Urine NEGATIVE NEGATIVE   Ketones, ur NEGATIVE NEGATIVE mg/dL   Protein, ur NEGATIVE NEGATIVE mg/dL   Nitrite NEGATIVE NEGATIVE   Leukocytes, UA NEGATIVE NEGATIVE   0100 Dr. Mindi Slicker notified >  reviewed HPI/exam/OB hx > give terbutaline and recheck in 1-2 hours 0150 Rn informed me that pt states no longer feeling contractions; recheck 1 cm (no change), toco - irritability beginning at 1:30, no longer pattern; Dr. Mindi Slicker notified > may discharge home   Assessment and Plan  30 y.o. G1P0000 at [redacted]w[redacted]d IUP  Reactive NST x 2 False Labor  Plan: Discharge home Preterm labor precautions  Continue nightly prometrium Keep scheduled appointment at home  Marlis Edelson, CNM 09/28/2016 1:57 AM

## 2016-09-28 NOTE — Discharge Instructions (Signed)

## 2016-10-10 ENCOUNTER — Encounter (HOSPITAL_COMMUNITY): Payer: Self-pay | Admitting: Certified Nurse Midwife

## 2016-10-10 ENCOUNTER — Inpatient Hospital Stay (HOSPITAL_COMMUNITY)
Admission: AD | Admit: 2016-10-10 | Discharge: 2016-10-11 | Disposition: A | Discharge status: Home / Self Care | Payer: BLUE CROSS/BLUE SHIELD | Source: Ambulatory Visit | Attending: Obstetrics and Gynecology | Admitting: Obstetrics and Gynecology

## 2016-10-10 DIAGNOSIS — K529 Noninfective gastroenteritis and colitis, unspecified: Secondary | ICD-10-CM

## 2016-10-10 DIAGNOSIS — R112 Nausea with vomiting, unspecified: Secondary | ICD-10-CM | POA: Insufficient documentation

## 2016-10-10 DIAGNOSIS — R197 Diarrhea, unspecified: Secondary | ICD-10-CM | POA: Diagnosis not present

## 2016-10-10 DIAGNOSIS — E860 Dehydration: Secondary | ICD-10-CM | POA: Diagnosis not present

## 2016-10-10 DIAGNOSIS — N858 Other specified noninflammatory disorders of uterus: Secondary | ICD-10-CM

## 2016-10-10 DIAGNOSIS — O26893 Other specified pregnancy related conditions, third trimester: Secondary | ICD-10-CM | POA: Diagnosis not present

## 2016-10-10 DIAGNOSIS — Z91013 Allergy to seafood: Secondary | ICD-10-CM | POA: Diagnosis not present

## 2016-10-10 DIAGNOSIS — R109 Unspecified abdominal pain: Secondary | ICD-10-CM | POA: Insufficient documentation

## 2016-10-10 DIAGNOSIS — Z3A33 33 weeks gestation of pregnancy: Secondary | ICD-10-CM | POA: Insufficient documentation

## 2016-10-10 DIAGNOSIS — M545 Low back pain: Secondary | ICD-10-CM | POA: Diagnosis not present

## 2016-10-10 DIAGNOSIS — M549 Dorsalgia, unspecified: Secondary | ICD-10-CM | POA: Diagnosis present

## 2016-10-10 DIAGNOSIS — O30003 Twin pregnancy, unspecified number of placenta and unspecified number of amniotic sacs, third trimester: Secondary | ICD-10-CM | POA: Insufficient documentation

## 2016-10-10 DIAGNOSIS — Z91040 Latex allergy status: Secondary | ICD-10-CM | POA: Diagnosis not present

## 2016-10-10 DIAGNOSIS — O99283 Endocrine, nutritional and metabolic diseases complicating pregnancy, third trimester: Secondary | ICD-10-CM | POA: Diagnosis not present

## 2016-10-10 DIAGNOSIS — N859 Noninflammatory disorder of uterus, unspecified: Secondary | ICD-10-CM

## 2016-10-10 DIAGNOSIS — O9989 Other specified diseases and conditions complicating pregnancy, childbirth and the puerperium: Secondary | ICD-10-CM | POA: Diagnosis not present

## 2016-10-10 LAB — URINALYSIS, ROUTINE W REFLEX MICROSCOPIC
Bilirubin Urine: NEGATIVE
GLUCOSE, UA: NEGATIVE mg/dL
HGB URINE DIPSTICK: NEGATIVE
Ketones, ur: 5 mg/dL — AB
NITRITE: NEGATIVE
PROTEIN: 30 mg/dL — AB
Specific Gravity, Urine: 1.017 (ref 1.005–1.030)
Trans Epithel, UA: <1
pH: 5 (ref 5.0–8.0)

## 2016-10-10 MED ORDER — LACTATED RINGERS IV SOLN
INTRAVENOUS | Status: DC
  Administered 2016-10-10 – 2016-10-11 (×2): via INTRAVENOUS

## 2016-10-10 MED ORDER — PROMETHAZINE HCL 25 MG/ML IJ SOLN
25.0000 mg | Freq: Four times a day (QID) | INTRAMUSCULAR | Status: DC | PRN
  Administered 2016-10-10: 25 mg via INTRAVENOUS
  Filled 2016-10-10: qty 1

## 2016-10-10 NOTE — MAU Provider Note (Signed)
Chief Complaint:  Contractions; Back Pain; pelvic preesure; and Diarrhea   First Provider Initiated Contact with Patient 10/10/16 2222     HPI: Susan Zhang is a 31 y.o. G1P0000 at 50w1dwho presents to maternity admissions reporting contractions all day.  Had some yesterday also.  Started having Nausea, vomiting and diarrhea this morning with a fever.  States had a baby shower and thinks maybe it was something she ate. . She reports good fetal movement, denies LOF, vaginal bleeding, vaginal itching/burning, urinary symptoms, h/a, dizziness, constipation or fever/chills.    Pregnancy has been remarkable for shortened cervix, for which she is using prometrium. Cervix has been 1cm and effaced.  Back Pain  This is a new problem. The current episode started today. The problem occurs intermittently. The problem is unchanged. The pain is present in the lumbar spine. The pain does not radiate. The pain is mild. Associated symptoms include abdominal pain and pelvic pain. Pertinent negatives include no dysuria, fever, headaches, leg pain or paresis.  Diarrhea   This is a new problem. The current episode started today. The problem occurs 5 to 10 times per day. Associated symptoms include abdominal pain and vomiting. Pertinent negatives include no fever or headaches. Nothing aggravates the symptoms. Risk factors include suspect food intake. She has tried nothing for the symptoms.  Emesis   This is a new problem. The current episode started today. The problem occurs 5 to 10 times per day. The problem has been gradually improving. Associated symptoms include abdominal pain and diarrhea. Pertinent negatives include no fever or headaches. Risk factors include suspect food intake. She has tried nothing for the symptoms.    RN note: Having ctxs since Friday. Have gotten closer now. Had temp 101 this am. N/V/D today. Back pain and pelvic pressure. Denies LOF or bleeding  Past Medical History: Past Medical  History:  Diagnosis Date  . Anemia   . Miscarriage   . No pertinent past medical history     Past obstetric history: OB History  Gravida Para Term Preterm AB Living  1 0 0 0 0 0  SAB TAB Ectopic Multiple Live Births  0 0 0 0      # Outcome Date GA Lbr Len/2nd Weight Sex Delivery Anes PTL Lv  1 Current               Past Surgical History: Past Surgical History:  Procedure Laterality Date  . dilation and curretage    . INDUCED ABORTION  2010  . SHOULDER SURGERY      Family History: Family History  Problem Relation Age of Onset  . Anesthesia problems Neg Hx     Social History: Social History  Substance Use Topics  . Smoking status: Never Smoker  . Smokeless tobacco: Never Used  . Alcohol use No    Allergies:  Allergies  Allergen Reactions  . Shrimp [Shellfish Allergy] Other (See Comments)    Throat swells    . Catfish [Fish Allergy] Rash  . Latex Rash    Meds:  Prescriptions Prior to Admission  Medication Sig Dispense Refill Last Dose  . ondansetron (ZOFRAN ODT) 8 MG disintegrating tablet Take 1 tablet (8 mg total) by mouth every 8 (eight) hours as needed for nausea or vomiting. 20 tablet 0   . Prenatal Vit-Fe Fumarate-FA (PRENATAL MULTIVITAMIN) TABS tablet Take 1 tablet by mouth daily at 12 noon.   05/29/2016 at Unknown time    I have reviewed patient's Past Medical Hx, Surgical Hx,  Family Hx, Social Hx, medications and allergies.   ROS:  Review of Systems  Constitutional: Negative for fever.  Gastrointestinal: Positive for abdominal pain, diarrhea and vomiting.  Genitourinary: Positive for pelvic pain. Negative for dysuria.  Musculoskeletal: Positive for back pain.  Neurological: Negative for headaches.   Other systems negative  Physical Exam  Patient Vitals for the past 24 hrs:  BP Temp Pulse Resp Height Weight  10/10/16 2153 129/73 - (!) 107 - - -  10/10/16 2151 - 98.1 F (36.7 C) - 18  (1.702 m) 295 lb (133.8 kg)   Constitutional:  Well-developed, well-nourished female in no acute distress.  Cardiovascular: normal rate and rhythm Respiratory: normal effort, clear to auscultation bilaterally GI: Abd soft, non-tender, gravid appropriate for gestational age.   No rebound or guarding. MS: Extremities nontender, no edema, normal ROM Neurologic: Alert and oriented x 4.  GU: Neg CVAT.  PELVIC EXAM: Dilation: 1 Effacement (%): 100 Station: -2 Exam by:: Wynelle Bourgeois CNM    FHT:  Baseline 140 both twins , moderate variability, accelerations present, no decelerations Contractions: Uterine irritability noted   Labs:    Results for orders placed or performed during the hospital encounter of 10/10/16 (from the past 24 hour(s))  Urinalysis, Routine w reflex microscopic     Status: Abnormal   Collection Time: 10/10/16 10:00 PM  Result Value Ref Range   Color, Urine YELLOW YELLOW   APPearance HAZY (A) CLEAR   Specific Gravity, Urine 1.017 1.005 - 1.030   pH 5.0 5.0 - 8.0   Glucose, UA NEGATIVE NEGATIVE mg/dL   Hgb urine dipstick NEGATIVE NEGATIVE   Bilirubin Urine NEGATIVE NEGATIVE   Ketones, ur 5 (A) NEGATIVE mg/dL   Protein, ur 30 (A) NEGATIVE mg/dL   Nitrite NEGATIVE NEGATIVE   Leukocytes, UA TRACE (A) NEGATIVE   RBC / HPF 0-5 0 - 5 RBC/hpf   WBC, UA 6-30 0 - 5 WBC/hpf   Bacteria, UA RARE (A) NONE SEEN   Squamous Epithelial / LPF 6-30 (A) NONE SEEN   Trans Epithel, UA <1    Mucous PRESENT   CBC with Differential/Platelet     Status: Abnormal   Collection Time: 10/10/16 11:23 PM  Result Value Ref Range   WBC 4.7 4.0 - 10.5 K/uL   RBC 3.01 (L) 3.87 - 5.11 MIL/uL   Hemoglobin 9.5 (L) 12.0 - 15.0 g/dL   HCT 16.1 (L) 09.6 - 04.5 %   MCV 93.0 78.0 - 100.0 fL   MCH 31.6 26.0 - 34.0 pg   MCHC 33.9 30.0 - 36.0 g/dL   RDW 40.9 81.1 - 91.4 %   Platelets 146 (L) 150 - 400 K/uL   Neutrophils Relative % 54 %   Neutro Abs 2.5 1.7 - 7.7 K/uL   Lymphocytes Relative 42 %   Lymphs Abs 2.0 0.7 - 4.0 K/uL   Monocytes  Relative 4 %   Monocytes Absolute 0.2 0.1 - 1.0 K/uL   Eosinophils Relative 0 %   Eosinophils Absolute 0.0 0.0 - 0.7 K/uL   Basophils Relative 0 %   Basophils Absolute 0.0 0.0 - 0.1 K/uL     Imaging:  No results found.  MAU Course/MDM: I have ordered labs and reviewed results. No leukocytosis noted. Urine normal  NST reviewed Consult Dr Domenica Reamer with presentation, exam findings and test results.  Treatments in MAU included IV hydration and Phenergan for nausea.   We gave her 2 liters which alleviated her nausea and diarrhea She continued  to have irritability but was not feeling it. I checked her cervix, which did not change Reconsulted Dr Mindi Slicker, who authorized discharge home with precautions  Assessment: Twin IUP at [redacted]w[redacted]d Probable gastroenteritis vs food poisoning Mild dehydration, corrected Uterine irritability without further cervical change  Plan: Discharge home Preterm Labor precautions and fetal kick counts Follow up in Office for prenatal visits and recheck of status  Encouraged to return here or to other Urgent Care/ED if she develops worsening of symptoms, increase in pain, fever, or other concerning symptoms.   Pt stable at time of discharge.  Wynelle Bourgeois CNM, MSN Certified Nurse-Midwife 10/10/2016 10:22 PM

## 2016-10-10 NOTE — MAU Note (Signed)
Having ctxs since Friday. Have gotten closer now. Had temp 101 this am. N/V/D today. Back pain and pelvic pressure. Denies LOF or bleeding

## 2016-10-11 DIAGNOSIS — O9989 Other specified diseases and conditions complicating pregnancy, childbirth and the puerperium: Secondary | ICD-10-CM

## 2016-10-11 DIAGNOSIS — K529 Noninfective gastroenteritis and colitis, unspecified: Secondary | ICD-10-CM

## 2016-10-11 LAB — CBC WITH DIFFERENTIAL/PLATELET
BASOS ABS: 0 10*3/uL (ref 0.0–0.1)
Basophils Relative: 0 %
Eosinophils Absolute: 0 10*3/uL (ref 0.0–0.7)
Eosinophils Relative: 0 %
HEMATOCRIT: 28 % — AB (ref 36.0–46.0)
HEMOGLOBIN: 9.5 g/dL — AB (ref 12.0–15.0)
LYMPHS ABS: 2 10*3/uL (ref 0.7–4.0)
LYMPHS PCT: 42 %
MCH: 31.6 pg (ref 26.0–34.0)
MCHC: 33.9 g/dL (ref 30.0–36.0)
MCV: 93 fL (ref 78.0–100.0)
MONO ABS: 0.2 10*3/uL (ref 0.1–1.0)
Monocytes Relative: 4 %
Neutro Abs: 2.5 10*3/uL (ref 1.7–7.7)
Neutrophils Relative %: 54 %
Platelets: 146 10*3/uL — ABNORMAL LOW (ref 150–400)
RBC: 3.01 MIL/uL — ABNORMAL LOW (ref 3.87–5.11)
RDW: 14 % (ref 11.5–15.5)
WBC: 4.7 10*3/uL (ref 4.0–10.5)

## 2016-10-11 MED ORDER — PROMETHAZINE HCL 25 MG PO TABS: 25.0000 mg | ORAL_TABLET | Freq: Four times a day (QID) | ORAL | 2 refills | Status: DC | PRN

## 2016-10-11 NOTE — Discharge Instructions (Signed)

## 2016-10-18 ENCOUNTER — Encounter (HOSPITAL_COMMUNITY): Payer: Self-pay | Admitting: *Deleted

## 2016-10-18 ENCOUNTER — Inpatient Hospital Stay (HOSPITAL_COMMUNITY)
Admission: AD | Admit: 2016-10-18 | Discharge: 2016-10-18 | Disposition: A | Discharge status: Home / Self Care | Payer: BLUE CROSS/BLUE SHIELD | Source: Ambulatory Visit | Attending: Obstetrics and Gynecology | Admitting: Obstetrics and Gynecology

## 2016-10-18 DIAGNOSIS — O30043 Twin pregnancy, dichorionic/diamniotic, third trimester: Secondary | ICD-10-CM | POA: Diagnosis not present

## 2016-10-18 DIAGNOSIS — O1403 Mild to moderate pre-eclampsia, third trimester: Secondary | ICD-10-CM | POA: Insufficient documentation

## 2016-10-18 DIAGNOSIS — Z3A34 34 weeks gestation of pregnancy: Secondary | ICD-10-CM | POA: Insufficient documentation

## 2016-10-18 DIAGNOSIS — Z91013 Allergy to seafood: Secondary | ICD-10-CM | POA: Diagnosis not present

## 2016-10-18 DIAGNOSIS — Z91040 Latex allergy status: Secondary | ICD-10-CM | POA: Insufficient documentation

## 2016-10-18 DIAGNOSIS — R109 Unspecified abdominal pain: Secondary | ICD-10-CM | POA: Diagnosis present

## 2016-10-18 LAB — COMPREHENSIVE METABOLIC PANEL
ALBUMIN: 2.6 g/dL — AB (ref 3.5–5.0)
ALT: 7 U/L — ABNORMAL LOW (ref 14–54)
ANION GAP: 9 (ref 5–15)
AST: 21 U/L (ref 15–41)
Alkaline Phosphatase: 147 U/L — ABNORMAL HIGH (ref 38–126)
BUN: 6 mg/dL (ref 6–20)
CHLORIDE: 104 mmol/L (ref 101–111)
CO2: 20 mmol/L — ABNORMAL LOW (ref 22–32)
Calcium: 8.7 mg/dL — ABNORMAL LOW (ref 8.9–10.3)
Creatinine, Ser: 0.79 mg/dL (ref 0.44–1.00)
GFR calc non Af Amer: >60 mL/min (ref >60)
GLUCOSE: 82 mg/dL (ref 65–99)
POTASSIUM: 4 mmol/L (ref 3.5–5.1)
SODIUM: 133 mmol/L — AB (ref 135–145)
Total Bilirubin: 0.7 mg/dL (ref 0.3–1.2)
Total Protein: 5.7 g/dL — ABNORMAL LOW (ref 6.5–8.1)

## 2016-10-18 LAB — URINALYSIS, ROUTINE W REFLEX MICROSCOPIC
BILIRUBIN URINE: NEGATIVE
GLUCOSE, UA: NEGATIVE mg/dL
Hgb urine dipstick: NEGATIVE
KETONES UR: 5 mg/dL — AB
Leukocytes, UA: NEGATIVE
Nitrite: NEGATIVE
PROTEIN: 100 mg/dL — AB
Specific Gravity, Urine: 1.017 (ref 1.005–1.030)
pH: 5 (ref 5.0–8.0)

## 2016-10-18 LAB — CBC
HCT: 28.3 % — ABNORMAL LOW (ref 36.0–46.0)
Hemoglobin: 9.7 g/dL — ABNORMAL LOW (ref 12.0–15.0)
MCH: 31.5 pg (ref 26.0–34.0)
MCHC: 34.3 g/dL (ref 30.0–36.0)
MCV: 91.9 fL (ref 78.0–100.0)
PLATELETS: 148 10*3/uL — AB (ref 150–400)
RBC: 3.08 MIL/uL — ABNORMAL LOW (ref 3.87–5.11)
RDW: 13.7 % (ref 11.5–15.5)
WBC: 4.8 10*3/uL (ref 4.0–10.5)

## 2016-10-18 LAB — PROTEIN / CREATININE RATIO, URINE
Creatinine, Urine: 369 mg/dL
Protein Creatinine Ratio: 0.3 mg/mg{Cre} — ABNORMAL HIGH (ref 0.00–0.15)
Total Protein, Urine: 112 mg/dL

## 2016-10-18 NOTE — MAU Note (Signed)
Pt here with c/o contractions about 4-5 minutes apart since this morning. Denies any bleeding or leaking of fluid. Having discharge and increased pressure. Was 1 cm last week, but reports shortened cervix.

## 2016-10-18 NOTE — MAU Provider Note (Signed)
History     CSN: [id number]  Arrival date and time: 10/18/16 7829   First Provider Initiated Contact with Patient 10/18/16 2034      Chief Complaint  Patient presents with  . Contractions   Susan Zhang is a 31 y.o. G1P0000 at [redacted]w[redacted]d with di/di twins who presents today with contractions. She states that she has had contractions since yesterday evening, and she was up a lot last night with the contractions. She took tylenol PM, and rested some, but awoke with contractions today. She denies any VB or LOF. She confirms normal fetal movement. She denies any hx of high blood pressure. No other complications with this pregnancy. She is planning a vaginal delivery with this pregnancy. Next appointment in the office 10/20/16. She had BMZ on 1/23 and 1/24. She states that the last time she had a cervical length in the office it was 0.54 on 10/13/16. She denies intercourse or anything in the vagina in the last 24 hours.    Past Medical History:  Diagnosis Date  . Anemia   . No pertinent past medical history     Past Surgical History:  Procedure Laterality Date  . NO PAST SURGERIES      Family History  Problem Relation Age of Onset  . Anesthesia problems Neg Hx     Social History  Substance Use Topics  . Smoking status: Never Smoker  . Smokeless tobacco: Never Used  . Alcohol use No    Allergies:  Allergies  Allergen Reactions  . Shrimp [Shellfish Allergy] Other (See Comments)    Throat swells    . Catfish [Fish Allergy] Rash  . Latex Rash    Prescriptions Prior to Admission  Medication Sig Dispense Refill Last Dose  . acetaminophen (TYLENOL) 500 MG chewable tablet Chew 1,000 mg by mouth every 6 (six) hours as needed for pain.   10/17/2016 at Unknown time  . cyclobenzaprine (FLEXERIL) 5 MG tablet Take 5 mg by mouth 3 (three) times daily as needed for muscle spasms.   Past Month at Unknown time  . Prenatal Vit-Fe Fumarate-FA (PRENATAL MULTIVITAMIN) TABS tablet Take 1 tablet by  mouth daily at 12 noon.   10/17/2016 at Unknown time  . progesterone 200 MG SUPP Place 200 mg vaginally at bedtime.   10/17/2016 at Unknown time  . ondansetron (ZOFRAN ODT) 8 MG disintegrating tablet Take 1 tablet (8 mg total) by mouth every 8 (eight) hours as needed for nausea or vomiting. 20 tablet 0 More than a month at Unknown time  . promethazine (PHENERGAN) 25 MG tablet Take 1 tablet (25 mg total) by mouth every 6 (six) hours as needed for nausea or vomiting. 30 tablet 2 More than a month at Unknown time    Review of Systems Physical Exam   Blood pressure (!) 133/104, pulse 95, temperature 98.1 F (36.7 C), temperature source Oral, resp. rate 20, height  (1.702 m), weight 300 lb (136.1 kg), SpO2 100 %.  Physical Exam  Nursing note and vitals reviewed. Constitutional: She is oriented to person, place, and time. She appears well-developed and well-nourished. No distress.  HENT:  Head: Normocephalic.  Cardiovascular: Normal rate.   Respiratory: Effort normal.  GI: Soft. There is no tenderness. There is no rebound.  Genitourinary:  Genitourinary Comments: Cervix: 1/90/-1/baby A vertex.   Neurological: She is alert and oriented to person, place, and time.  Skin: Skin is warm and dry.  Psychiatric: She has a normal mood and affect.   FHT:  Baby A: 140, moderate with 15x15 accels, no decels Baby B: `35, moderate with 15x15 accels, no decels Toco: irregular.   Results for orders placed or performed during the hospital encounter of 10/18/16 (from the past 24 hour(s))  Urinalysis, Routine w reflex microscopic     Status: Abnormal   Collection Time: 10/18/16  7:40 PM  Result Value Ref Range   Color, Urine YELLOW YELLOW   APPearance HAZY (A) CLEAR   Specific Gravity, Urine 1.017 1.005 - 1.030   pH 5.0 5.0 - 8.0   Glucose, UA NEGATIVE NEGATIVE mg/dL   Hgb urine dipstick NEGATIVE NEGATIVE   Bilirubin Urine NEGATIVE NEGATIVE   Ketones, ur 5 (A) NEGATIVE mg/dL   Protein, ur 161 (A)  NEGATIVE mg/dL   Nitrite NEGATIVE NEGATIVE   Leukocytes, UA NEGATIVE NEGATIVE   RBC / HPF 0-5 0 - 5 RBC/hpf   WBC, UA 6-30 0 - 5 WBC/hpf   Bacteria, UA RARE (A) NONE SEEN   Squamous Epithelial / LPF 0-5 (A) NONE SEEN   Trans Epithel, UA <1    Mucous PRESENT    Hyaline Casts, UA PRESENT    Granular Casts, UA PRESENT   Protein / creatinine ratio, urine     Status: Abnormal   Collection Time: 10/18/16  7:40 PM  Result Value Ref Range   Creatinine, Urine 369.00 mg/dL   Total Protein, Urine 112 mg/dL   Protein Creatinine Ratio 0.30 (H) 0.00 - 0.15 mg/mg[Cre]  CBC     Status: Abnormal   Collection Time: 10/18/16  8:50 PM  Result Value Ref Range   WBC 4.8 4.0 - 10.5 K/uL   RBC 3.08 (L) 3.87 - 5.11 MIL/uL   Hemoglobin 9.7 (L) 12.0 - 15.0 g/dL   HCT 09.6 (L) 04.5 - 40.9 %   MCV 91.9 78.0 - 100.0 fL   MCH 31.5 26.0 - 34.0 pg   MCHC 34.3 30.0 - 36.0 g/dL   RDW 81.1 91.4 - 78.2 %   Platelets 148 (L) 150 - 400 K/uL  Comprehensive metabolic panel     Status: Abnormal   Collection Time: 10/18/16  8:50 PM  Result Value Ref Range   Sodium 133 (L) 135 - 145 mmol/L   Potassium 4.0 3.5 - 5.1 mmol/L   Chloride 104 101 - 111 mmol/L   CO2 20 (L) 22 - 32 mmol/L   Glucose, Bld 82 65 - 99 mg/dL   BUN 6 6 - 20 mg/dL   Creatinine, Ser 9.56 0.44 - 1.00 mg/dL   Calcium 8.7 (L) 8.9 - 10.3 mg/dL   Total Protein 5.7 (L) 6.5 - 8.1 g/dL   Albumin 2.6 (L) 3.5 - 5.0 g/dL   AST 21 15 - 41 U/L   ALT 7 (L) 14 - 54 U/L   Alkaline Phosphatase 147 (H) 38 - 126 U/L   Total Bilirubin 0.7 0.3 - 1.2 mg/dL   GFR calc non Af Amer >60 >60 mL/min   GFR calc Af Amer >60 >60 mL/min   Anion gap 9 5 - 15    MAU Course  Procedures  MDM 2205: D/W Dr. Jackelyn Knife reviewed labs/vitals/FHR tracing, pre-eclampsia without severe features. OK for DC home, and FU on Tuesday as planned will make a plan going forward from there.   Assessment and Plan   1. Mild pre-eclampsia in third trimester   2. [redacted] weeks gestation of  pregnancy    Preeclampsia without severe features  DC home Pre-eclampsia warning signs reviewed, return to MAU  as needed for developing symptoms  PTL precautions  Fetal kick counts RX: none  Return to MAU as needed FU with OB as planned  Follow-up Information    Zenaida Niece, MD Follow up.   Specialty:  Obstetrics and Gynecology Why:  as scheduled for TUESDAY 4/10 Contact information: [address] [place] [address] [phone]            Tawnya Crook 10/18/2016, 8:36 PM

## 2016-10-18 NOTE — Discharge Instructions (Signed)

## 2016-10-20 ENCOUNTER — Encounter (HOSPITAL_COMMUNITY): Payer: Self-pay | Admitting: General Practice

## 2016-10-20 ENCOUNTER — Inpatient Hospital Stay (HOSPITAL_COMMUNITY)
Admission: AD | Admit: 2016-10-20 | Discharge: 2016-10-27 | DRG: 774 | Disposition: A | Discharge status: Home / Self Care | Payer: BLUE CROSS/BLUE SHIELD | Source: Ambulatory Visit | Attending: Obstetrics and Gynecology | Admitting: Obstetrics and Gynecology

## 2016-10-20 DIAGNOSIS — O26873 Cervical shortening, third trimester: Secondary | ICD-10-CM | POA: Diagnosis present

## 2016-10-20 DIAGNOSIS — O30043 Twin pregnancy, dichorionic/diamniotic, third trimester: Secondary | ICD-10-CM | POA: Diagnosis present

## 2016-10-20 DIAGNOSIS — O1404 Mild to moderate pre-eclampsia, complicating childbirth: Principal | ICD-10-CM | POA: Diagnosis present

## 2016-10-20 DIAGNOSIS — Z833 Family history of diabetes mellitus: Secondary | ICD-10-CM

## 2016-10-20 DIAGNOSIS — Z3A34 34 weeks gestation of pregnancy: Secondary | ICD-10-CM

## 2016-10-20 DIAGNOSIS — Z8249 Family history of ischemic heart disease and other diseases of the circulatory system: Secondary | ICD-10-CM

## 2016-10-20 DIAGNOSIS — O133 Gestational [pregnancy-induced] hypertension without significant proteinuria, third trimester: Secondary | ICD-10-CM

## 2016-10-20 DIAGNOSIS — O1205 Gestational edema, complicating the puerperium: Secondary | ICD-10-CM | POA: Diagnosis not present

## 2016-10-20 DIAGNOSIS — O30009 Twin pregnancy, unspecified number of placenta and unspecified number of amniotic sacs, unspecified trimester: Secondary | ICD-10-CM

## 2016-10-20 HISTORY — DX: Gestational (pregnancy-induced) hypertension without significant proteinuria, third trimester: O13.3

## 2016-10-20 HISTORY — DX: Twin pregnancy, unspecified number of placenta and unspecified number of amniotic sacs, unspecified trimester: O30.009

## 2016-10-20 LAB — CBC
HEMATOCRIT: 27 % — AB (ref 36.0–46.0)
HEMOGLOBIN: 9.1 g/dL — AB (ref 12.0–15.0)
MCH: 31 pg (ref 26.0–34.0)
MCHC: 33.7 g/dL (ref 30.0–36.0)
MCV: 91.8 fL (ref 78.0–100.0)
Platelets: 140 10*3/uL — ABNORMAL LOW (ref 150–400)
RBC: 2.94 MIL/uL — ABNORMAL LOW (ref 3.87–5.11)
RDW: 13.7 % (ref 11.5–15.5)
WBC: 4.3 10*3/uL (ref 4.0–10.5)

## 2016-10-20 LAB — COMPREHENSIVE METABOLIC PANEL
ALBUMIN: 2.6 g/dL — AB (ref 3.5–5.0)
ALK PHOS: 139 U/L — AB (ref 38–126)
ALT: 6 U/L — ABNORMAL LOW (ref 14–54)
ANION GAP: 6 (ref 5–15)
AST: 22 U/L (ref 15–41)
BILIRUBIN TOTAL: 0.6 mg/dL (ref 0.3–1.2)
BUN: 6 mg/dL (ref 6–20)
CALCIUM: 8.7 mg/dL — AB (ref 8.9–10.3)
CO2: 21 mmol/L — ABNORMAL LOW (ref 22–32)
Chloride: 107 mmol/L (ref 101–111)
Creatinine, Ser: 0.7 mg/dL (ref 0.44–1.00)
GFR calc Af Amer: >60 mL/min (ref >60)
GLUCOSE: 81 mg/dL (ref 65–99)
POTASSIUM: 4.1 mmol/L (ref 3.5–5.1)
Sodium: 134 mmol/L — ABNORMAL LOW (ref 135–145)
TOTAL PROTEIN: 5.6 g/dL — AB (ref 6.5–8.1)

## 2016-10-20 LAB — TYPE AND SCREEN
ABO/RH(D): O POS
ANTIBODY SCREEN: NEGATIVE

## 2016-10-20 MED ORDER — SODIUM CHLORIDE 0.9% FLUSH: 3.0000 mL | INTRAVENOUS | Status: DC | PRN

## 2016-10-20 MED ORDER — ACETAMINOPHEN 325 MG PO TABS
650.0000 mg | ORAL_TABLET | ORAL | Status: DC | PRN
  Administered 2016-10-21 – 2016-10-24 (×3): 650 mg via ORAL
  Filled 2016-10-20 (×4): qty 2

## 2016-10-20 MED ORDER — SODIUM CHLORIDE 0.9% FLUSH
3.0000 mL | Freq: Two times a day (BID) | INTRAVENOUS | Status: DC
  Administered 2016-10-20 – 2016-10-24 (×9): 3 mL via INTRAVENOUS

## 2016-10-20 MED ORDER — CALCIUM CARBONATE ANTACID 500 MG PO CHEW
2.0000 | CHEWABLE_TABLET | ORAL | Status: DC | PRN
  Administered 2016-10-21: 400 mg via ORAL
  Filled 2016-10-20: qty 2

## 2016-10-20 MED ORDER — DOCUSATE SODIUM 100 MG PO CAPS
100.0000 mg | ORAL_CAPSULE | Freq: Every day | ORAL | Status: DC
  Administered 2016-10-21 – 2016-10-24 (×4): 100 mg via ORAL
  Filled 2016-10-20 (×4): qty 1

## 2016-10-20 MED ORDER — PRENATAL MULTIVITAMIN CH
1.0000 | ORAL_TABLET | Freq: Every day | ORAL | Status: DC
  Administered 2016-10-20 – 2016-10-23 (×4): 1 via ORAL
  Filled 2016-10-20 (×4): qty 1

## 2016-10-20 MED ORDER — ZOLPIDEM TARTRATE 5 MG PO TABS
5.0000 mg | ORAL_TABLET | Freq: Every evening | ORAL | Status: DC | PRN
  Administered 2016-10-22 – 2016-10-24 (×2): 5 mg via ORAL
  Filled 2016-10-20 (×2): qty 1

## 2016-10-20 MED ORDER — SODIUM CHLORIDE 0.9 % IV SOLN: 250.0000 mL | INTRAVENOUS | Status: DC | PRN

## 2016-10-20 NOTE — H&P (Signed)
Susan Zhang is a 31 y.o. female G1P0 at 74+4 with di/di twins.  Some ctx - without cervical change.  Pregnancy complicated by short cervix - at 23 week - treated with vaginal progesterone.  Growth followed by [place] - babies concordant.  Flu 11/21, Tdap 2/23.  Had BMZ at 23 week.  Pt driven consult to MFM at [facility] at 26 week, no cerclage recc.  Pt admitted with mildly elevated BP amd proteinuria from trace to 4+ today - will have 24hr urine collection and labs.       OB History    Gravida Para Term Preterm AB Living   1 0 0 0 0 0   SAB TAB Ectopic Multiple Live Births   0 0 0 0      G1 present  No abn pap, last 1/16 No STDs  Past Medical History:  Diagnosis Date  . Anemia   . No pertinent past medical history   . PIH (pregnancy induced hypertension), third trimester 10/20/2016   Past Surgical History:  Procedure Laterality Date  . NO PAST SURGERIES     Family History: HTN, DM, anemia Social History:  reports that she has never smoked. She has never used smokeless tobacco. She reports that she does not drink alcohol or use drugs. married  Meds PNV, vaginal progesterone All latex, shrimp and fish     Maternal Diabetes: No Genetic Screening: Normal Maternal Ultrasounds/Referrals: Normal Fetal Ultrasounds or other Referrals:  None Maternal Substance Abuse:  No Significant Maternal Medications:  Meds include: Other: vaginal progesterone Significant Maternal Lab Results:  Lab values include: Other: GBBS P - collected at office 4/10 Other Comments:  None  Review of Systems  Constitutional: Negative.   HENT: Negative.   Eyes: Negative.   Respiratory: Negative.   Cardiovascular: Negative.   Gastrointestinal: Positive for abdominal pain and heartburn.  Genitourinary: Negative.   Musculoskeletal: Positive for back pain.  Skin: Negative.   Neurological: Negative.   Psychiatric/Behavioral: Negative.    Maternal Medical History:  Contractions: Onset was more than 2 days ago.    Frequency: regular.   Perceived severity is moderate.    Fetal activity: Perceived fetal activity is normal.    Prenatal complications: PIH.   Prenatal Complications - Diabetes: none.      Blood pressure 138/86, pulse 97, temperature 98.4 F (36.9 C), temperature source Oral, resp. rate 16, height  (1.702 m), weight 136.1 kg (300 lb). Maternal Exam:  Uterine Assessment: Contraction frequency is irregular.   Abdomen: Fundal height is appropriate for gestation - 42cm.   Estimated fetal weight is A 4#6, B 4#10 - [place] 10/13/16.   Fetal presentation: vertex  Introitus: Normal vulva. Normal vagina.  Cervix: Cervix evaluated by digital exam.     Physical Exam  Constitutional: She is oriented to person, place, and time. She appears well-developed and well-nourished.  HENT:  Head: Normocephalic and atraumatic.  Cardiovascular: Normal rate and regular rhythm.   Respiratory: Effort normal and breath sounds normal. No respiratory distress. She has no wheezes.  GI: Soft. Bowel sounds are normal. She exhibits no distension. There is tenderness.  Musculoskeletal: Normal range of motion.  Neurological: She is alert and oriented to person, place, and time.  Skin: Skin is warm and dry.  Psychiatric: She has a normal mood and affect. Her behavior is normal.    Prenatal labs: ABO, Rh:  O+ Antibody:  neg Rubella:  immune RPR:   NR HBsAg:   neg HIV:   neg  GBS:   Pending  Hgb 10.1/Plt 230K/ GC neg/Chl neg/nl Hgb electro/First Trimester WNL, nl NT x 2/glucola 96  [place] followed for growth, concordant growth Nl anat, A ant plac, B post plac  10/13/16 [place] A vtx, 4#6, post plac  B oblique, vtx, 4#10, ant plac Female x 2 - 5%discordant   Flu 11/21, Tdap 2/23 Assessment/Plan: 30yo G1P0 at 34+4 with increased proteinuris (4+) and mildly elevated BP; di/di twins 1. Admit for 24hr urine and labs 2. q shift NST 3. Close monitoring   Susan Zhang 10/20/2016, 1:43 PM

## 2016-10-21 LAB — COMPREHENSIVE METABOLIC PANEL
ALBUMIN: 2.6 g/dL — AB (ref 3.5–5.0)
ALK PHOS: 139 U/L — AB (ref 38–126)
ALT: 7 U/L — AB (ref 14–54)
AST: 17 U/L (ref 15–41)
Anion gap: 7 (ref 5–15)
BUN: 6 mg/dL (ref 6–20)
CALCIUM: 8.5 mg/dL — AB (ref 8.9–10.3)
CO2: 20 mmol/L — AB (ref 22–32)
CREATININE: 0.64 mg/dL (ref 0.44–1.00)
Chloride: 107 mmol/L (ref 101–111)
GFR calc Af Amer: >60 mL/min (ref >60)
GFR calc non Af Amer: >60 mL/min (ref >60)
GLUCOSE: 113 mg/dL — AB (ref 65–99)
Potassium: 3.6 mmol/L (ref 3.5–5.1)
SODIUM: 134 mmol/L — AB (ref 135–145)
Total Bilirubin: 0.4 mg/dL (ref 0.3–1.2)
Total Protein: 5.9 g/dL — ABNORMAL LOW (ref 6.5–8.1)

## 2016-10-21 LAB — PROTEIN, URINE, 24 HOUR
COLLECTION INTERVAL-UPROT: 24 h
PROTEIN, URINE: 55 mg/dL
Protein, 24H Urine: 825 mg/d — ABNORMAL HIGH (ref 50–100)
Urine Total Volume-UPROT: 1500 mL

## 2016-10-21 LAB — CREATININE CLEARANCE, URINE, 24 HOUR
Collection Interval-CRCL: 24 hours
Creatinine Clearance: 181 mL/min — ABNORMAL HIGH (ref 75–115)
Creatinine, 24H Ur: 1671 mg/d (ref 600–1800)
Creatinine, Urine: 111.4 mg/dL
Urine Total Volume-CRCL: 1500 mL

## 2016-10-21 MED ORDER — OXYCODONE-ACETAMINOPHEN 5-325 MG PO TABS
1.0000 | ORAL_TABLET | Freq: Once | ORAL | Status: AC
  Administered 2016-10-24: 1 via ORAL
  Filled 2016-10-21: qty 1

## 2016-10-21 NOTE — Progress Notes (Signed)
HD#2, [redacted]W[redacted]D, di/di twins, preeclampsia Feels ok, had headache last night that went away with hydration, +FM Afeb, VSS, BP 130-150/80-100 FHT- reactive x 2 last pm Will continue 24 hr urine as ordered by Dr. Ellyn Hack, had UPC of 0.3 on 4-8, monitor BP and treat if needed

## 2016-10-21 NOTE — Progress Notes (Signed)
Has had RUQ pain most of the day, not relieved by antacids, relieved by tylenol.  Nothing else new, +FM Afeb, VSS, BP 130-150/90-100 FHT- reactive x 2 CMP stable 24 hr urine with 825 mg protein Stable preeclampsia without severe features, continue close monitoring

## 2016-10-22 DIAGNOSIS — Z3A34 34 weeks gestation of pregnancy: Secondary | ICD-10-CM | POA: Diagnosis not present

## 2016-10-22 DIAGNOSIS — O1205 Gestational edema, complicating the puerperium: Secondary | ICD-10-CM | POA: Diagnosis not present

## 2016-10-22 DIAGNOSIS — O30043 Twin pregnancy, dichorionic/diamniotic, third trimester: Secondary | ICD-10-CM | POA: Diagnosis present

## 2016-10-22 DIAGNOSIS — O1404 Mild to moderate pre-eclampsia, complicating childbirth: Secondary | ICD-10-CM | POA: Diagnosis present

## 2016-10-22 DIAGNOSIS — O26873 Cervical shortening, third trimester: Secondary | ICD-10-CM | POA: Diagnosis present

## 2016-10-22 DIAGNOSIS — Z833 Family history of diabetes mellitus: Secondary | ICD-10-CM | POA: Diagnosis not present

## 2016-10-22 DIAGNOSIS — Z8249 Family history of ischemic heart disease and other diseases of the circulatory system: Secondary | ICD-10-CM | POA: Diagnosis not present

## 2016-10-22 DIAGNOSIS — R030 Elevated blood-pressure reading, without diagnosis of hypertension: Secondary | ICD-10-CM | POA: Diagnosis present

## 2016-10-22 MED ORDER — BETAMETHASONE SOD PHOS & ACET 6 (3-3) MG/ML IJ SUSP
12.0000 mg | Freq: Once | INTRAMUSCULAR | Status: AC
  Administered 2016-10-22: 12 mg via INTRAMUSCULAR
  Filled 2016-10-22: qty 2

## 2016-10-22 MED ORDER — LORATADINE 10 MG PO TABS
10.0000 mg | ORAL_TABLET | Freq: Every day | ORAL | Status: DC | PRN
  Administered 2016-10-22 – 2016-10-23 (×2): 10 mg via ORAL
  Filled 2016-10-22 (×3): qty 1

## 2016-10-22 NOTE — Progress Notes (Signed)
Dr. Senaida Ores @ bedside and FHR tracing reviewed by MD.

## 2016-10-22 NOTE — Progress Notes (Signed)
Patient ID: Susan Zhang, female   DOB: May 03, 1986, 31 y.o.   MRN: [id number] Pt feeling fine, had wheelchair ride and made her feel better. Good FM x 2 NST category 1 x 2  BP 130-150/90's  Gravid NT  Will repeat labs in AM Gave booster of betamethasone today Will continue to follow, explained to pt trying to get her another 1-2 weeks

## 2016-10-22 NOTE — Progress Notes (Signed)
Patient ID: Susan Zhang, female   DOB: 1986/01/18, 31 y.o.   MRN: [id number] HD #2 di/di twins preeclampsia without severe features 34 6/7 weeks  Pt feeling well this AM.  No HA no RUQ pain.  Some intermittent contractions.  Good FM  BP 132-158/78-104 Fundus NT NST reactive x 2  Di/di twins vtx/oblique on last [place] 4/3 Received betamethasone 1/18 so will give booster dose Will continue to follow closely, repeat labs in AM D/w pt would deliver for severe range BP, abnormal labs, severe symptoms--hope to get her another 1-2 weeks

## 2016-10-23 LAB — COMPREHENSIVE METABOLIC PANEL
ALK PHOS: 140 U/L — AB (ref 38–126)
ALT: 7 U/L — AB (ref 14–54)
AST: 17 U/L (ref 15–41)
Albumin: 2.5 g/dL — ABNORMAL LOW (ref 3.5–5.0)
Anion gap: 8 (ref 5–15)
BUN: 9 mg/dL (ref 6–20)
CALCIUM: 8.9 mg/dL (ref 8.9–10.3)
CO2: 20 mmol/L — ABNORMAL LOW (ref 22–32)
CREATININE: 0.79 mg/dL (ref 0.44–1.00)
Chloride: 105 mmol/L (ref 101–111)
Glucose, Bld: 118 mg/dL — ABNORMAL HIGH (ref 65–99)
Potassium: 4 mmol/L (ref 3.5–5.1)
Sodium: 133 mmol/L — ABNORMAL LOW (ref 135–145)
Total Bilirubin: 0.3 mg/dL (ref 0.3–1.2)
Total Protein: 6 g/dL — ABNORMAL LOW (ref 6.5–8.1)

## 2016-10-23 LAB — CBC
HCT: 26.1 % — ABNORMAL LOW (ref 36.0–46.0)
HEMOGLOBIN: 8.8 g/dL — AB (ref 12.0–15.0)
MCH: 31.3 pg (ref 26.0–34.0)
MCHC: 33.7 g/dL (ref 30.0–36.0)
MCV: 92.9 fL (ref 78.0–100.0)
Platelets: 160 10*3/uL (ref 150–400)
RBC: 2.81 MIL/uL — AB (ref 3.87–5.11)
RDW: 14 % (ref 11.5–15.5)
WBC: 5 10*3/uL (ref 4.0–10.5)

## 2016-10-23 LAB — TYPE AND SCREEN
ABO/RH(D): O POS
ANTIBODY SCREEN: NEGATIVE

## 2016-10-23 MED ORDER — BETAMETHASONE SOD PHOS & ACET 6 (3-3) MG/ML IJ SUSP
12.0000 mg | Freq: Once | INTRAMUSCULAR | Status: AC
  Administered 2016-10-23: 12 mg via INTRAMUSCULAR
  Filled 2016-10-23: qty 2

## 2016-10-23 NOTE — Progress Notes (Signed)
Patient ID: Susan Zhang, female   DOB: September 06, 1985, 31 y.o.   MRN: [id number]   Di/Di Twins/PreE  +FM x 2, no LOF, no VB, occ ctx; no PIH sx's - occ blurry vision.  D/w pt plan of care and delivery plan.  AFVSS (BP 140-150/90's gen NAD FHTs A 120-130 mod var, accels, category 1 R  B 120's-130's mod var, accels, category 1 R  toco occ  30yo G1P0 at 78 with di/di twins, short cervix, PreE  1. Close monitoring 2. Follow labs and NST and sx's 3. Pt desires vaginal deliveries  D/W MFM - can d/c to home as no severe features, recc delivery at 37 weeks, if symptomatic try for at least 36, 2nd dose of steroids to complete course.    Pt declines discharge, too nervous

## 2016-10-24 LAB — COMPREHENSIVE METABOLIC PANEL
ALBUMIN: 2.7 g/dL — AB (ref 3.5–5.0)
ALT: 8 U/L — ABNORMAL LOW (ref 14–54)
ANION GAP: 7 (ref 5–15)
AST: 22 U/L (ref 15–41)
Alkaline Phosphatase: 146 U/L — ABNORMAL HIGH (ref 38–126)
BUN: 7 mg/dL (ref 6–20)
CO2: 21 mmol/L — AB (ref 22–32)
Calcium: 9.4 mg/dL (ref 8.9–10.3)
Chloride: 109 mmol/L (ref 101–111)
Creatinine, Ser: 0.65 mg/dL (ref 0.44–1.00)
GFR calc Af Amer: >60 mL/min (ref >60)
GFR calc non Af Amer: >60 mL/min (ref >60)
GLUCOSE: 100 mg/dL — AB (ref 65–99)
POTASSIUM: 3.9 mmol/L (ref 3.5–5.1)
Sodium: 137 mmol/L (ref 135–145)
Total Bilirubin: 0.5 mg/dL (ref 0.3–1.2)
Total Protein: 6.4 g/dL — ABNORMAL LOW (ref 6.5–8.1)

## 2016-10-24 LAB — CBC WITH DIFFERENTIAL/PLATELET
BASOS ABS: 0 10*3/uL (ref 0.0–0.1)
BASOS PCT: 0 %
Basophils Absolute: 0 10*3/uL (ref 0.0–0.1)
Basophils Relative: 0 %
EOS PCT: 0 %
Eosinophils Absolute: 0 10*3/uL (ref 0.0–0.7)
Eosinophils Absolute: 0 10*3/uL (ref 0.0–0.7)
Eosinophils Relative: 0 %
HCT: 27.4 % — ABNORMAL LOW (ref 36.0–46.0)
HEMATOCRIT: 26.9 % — AB (ref 36.0–46.0)
HEMOGLOBIN: 9.2 g/dL — AB (ref 12.0–15.0)
Hemoglobin: 8.7 g/dL — ABNORMAL LOW (ref 12.0–15.0)
LYMPHS ABS: 1.6 10*3/uL (ref 0.7–4.0)
Lymphocytes Relative: 26 %
Lymphocytes Relative: 32 %
Lymphs Abs: 1.3 10*3/uL (ref 0.7–4.0)
MCH: 31.5 pg (ref 26.0–34.0)
MCH: 30.7 pg (ref 26.0–34.0)
MCHC: 33.6 g/dL (ref 30.0–36.0)
MCHC: 32.3 g/dL (ref 30.0–36.0)
MCV: 93.8 fL (ref 78.0–100.0)
MCV: 95.1 fL (ref 78.0–100.0)
MONO ABS: 0.4 10*3/uL (ref 0.1–1.0)
MONOS PCT: 7 %
Monocytes Absolute: 0.4 10*3/uL (ref 0.1–1.0)
Monocytes Relative: 8 %
NEUTROS PCT: 67 %
NEUTROS PCT: 60 %
Neutro Abs: 3.4 10*3/uL (ref 1.7–7.7)
Neutro Abs: 3 10*3/uL (ref 1.7–7.7)
PLATELETS: 165 10*3/uL (ref 150–400)
PLATELETS: 167 10*3/uL (ref 150–400)
RBC: 2.92 MIL/uL — ABNORMAL LOW (ref 3.87–5.11)
RBC: 2.83 MIL/uL — AB (ref 3.87–5.11)
RDW: 14.4 % (ref 11.5–15.5)
RDW: 14.6 % (ref 11.5–15.5)
WBC: 5.1 10*3/uL (ref 4.0–10.5)
WBC: 5.1 10*3/uL (ref 4.0–10.5)

## 2016-10-24 MED ORDER — TERBUTALINE SULFATE 1 MG/ML IJ SOLN: 0.2500 mg | Freq: Once | INTRAMUSCULAR | Status: DC | PRN

## 2016-10-24 MED ORDER — OXYTOCIN 40 UNITS IN LACTATED RINGERS INFUSION - SIMPLE MED
1.0000 m[IU]/min | INTRAVENOUS | Status: DC
Start: 2016-10-24 — End: 2016-10-25
  Administered 2016-10-24: 2 m[IU]/min via INTRAVENOUS
  Filled 2016-10-24: qty 1000

## 2016-10-24 MED ORDER — HYDRALAZINE HCL 20 MG/ML IJ SOLN: 10.0000 mg | Freq: Once | INTRAMUSCULAR | Status: DC | PRN

## 2016-10-24 MED ORDER — SALINE SPRAY 0.65 % NA SOLN
1.0000 | NASAL | Status: DC | PRN
  Filled 2016-10-24: qty 44

## 2016-10-24 MED ORDER — OXYCODONE-ACETAMINOPHEN 5-325 MG PO TABS: 2.0000 | ORAL_TABLET | ORAL | Status: DC | PRN

## 2016-10-24 MED ORDER — MAGNESIUM SULFATE BOLUS VIA INFUSION
4.0000 g | Freq: Once | INTRAVENOUS | Status: AC
  Administered 2016-10-24: 4 g via INTRAVENOUS
  Filled 2016-10-24: qty 500

## 2016-10-24 MED ORDER — OXYTOCIN 40 UNITS IN LACTATED RINGERS INFUSION - SIMPLE MED: 1.0000 m[IU]/min | INTRAVENOUS | Status: DC

## 2016-10-24 MED ORDER — OXYTOCIN BOLUS FROM INFUSION
500.0000 mL | Freq: Once | INTRAVENOUS | Status: AC
  Administered 2016-10-25: 500 mL via INTRAVENOUS

## 2016-10-24 MED ORDER — FENTANYL 2.5 MCG/ML BUPIVACAINE 1/10 % EPIDURAL INFUSION (WH - ANES)
INTRAMUSCULAR | Status: AC
  Filled 2016-10-24: qty 100

## 2016-10-24 MED ORDER — BUTORPHANOL TARTRATE 1 MG/ML IJ SOLN: 1.0000 mg | INTRAMUSCULAR | Status: DC | PRN

## 2016-10-24 MED ORDER — PHENYLEPHRINE 40 MCG/ML (10ML) SYRINGE FOR IV PUSH (FOR BLOOD PRESSURE SUPPORT)
80.0000 ug | PREFILLED_SYRINGE | INTRAVENOUS | Status: DC | PRN
Start: 2016-10-24 — End: 2016-10-27
  Filled 2016-10-24: qty 5

## 2016-10-24 MED ORDER — LACTATED RINGERS IV SOLN: 500.0000 mL | INTRAVENOUS | Status: DC | PRN

## 2016-10-24 MED ORDER — DIPHENHYDRAMINE HCL 25 MG PO CAPS
25.0000 mg | ORAL_CAPSULE | Freq: Four times a day (QID) | ORAL | Status: DC | PRN
  Administered 2016-10-24: 25 mg via ORAL
  Filled 2016-10-24: qty 1

## 2016-10-24 MED ORDER — OXYTOCIN 40 UNITS IN LACTATED RINGERS INFUSION - SIMPLE MED: 2.5000 [IU]/h | INTRAVENOUS | Status: DC

## 2016-10-24 MED ORDER — PHENYLEPHRINE 40 MCG/ML (10ML) SYRINGE FOR IV PUSH (FOR BLOOD PRESSURE SUPPORT)
PREFILLED_SYRINGE | INTRAVENOUS | Status: AC
  Filled 2016-10-24: qty 20

## 2016-10-24 MED ORDER — EPHEDRINE 5 MG/ML INJ
10.0000 mg | INTRAVENOUS | Status: DC | PRN
  Filled 2016-10-24: qty 2

## 2016-10-24 MED ORDER — MAGNESIUM SULFATE 40 G IN LACTATED RINGERS - SIMPLE
2.0000 g/h | INTRAVENOUS | Status: DC
  Administered 2016-10-24: 2 g/h via INTRAVENOUS
  Filled 2016-10-24: qty 500
  Filled 2016-10-24: qty 40

## 2016-10-24 MED ORDER — LABETALOL HCL 5 MG/ML IV SOLN
20.0000 mg | INTRAVENOUS | Status: DC | PRN
  Filled 2016-10-24: qty 16

## 2016-10-24 MED ORDER — ACETAMINOPHEN 325 MG PO TABS: 650.0000 mg | ORAL_TABLET | ORAL | Status: DC | PRN

## 2016-10-24 MED ORDER — FENTANYL 2.5 MCG/ML BUPIVACAINE 1/10 % EPIDURAL INFUSION (WH - ANES)
14.0000 mL/h | INTRAMUSCULAR | Status: DC | PRN
  Administered 2016-10-25: 14 mL/h via EPIDURAL

## 2016-10-24 MED ORDER — SOD CITRATE-CITRIC ACID 500-334 MG/5ML PO SOLN: 30.0000 mL | ORAL | Status: DC | PRN

## 2016-10-24 MED ORDER — LACTATED RINGERS IV SOLN
INTRAVENOUS | Status: DC
  Administered 2016-10-24: 22:00:00 via INTRAVENOUS

## 2016-10-24 MED ORDER — LIDOCAINE HCL (PF) 1 % IJ SOLN
30.0000 mL | INTRAMUSCULAR | Status: DC | PRN
  Filled 2016-10-24: qty 30

## 2016-10-24 MED ORDER — PHENYLEPHRINE 40 MCG/ML (10ML) SYRINGE FOR IV PUSH (FOR BLOOD PRESSURE SUPPORT)
80.0000 ug | PREFILLED_SYRINGE | INTRAVENOUS | Status: DC | PRN
  Filled 2016-10-24: qty 5

## 2016-10-24 MED ORDER — LACTATED RINGERS IV SOLN
500.0000 mL | Freq: Once | INTRAVENOUS | Status: AC
  Administered 2016-10-25: 500 mL via INTRAVENOUS

## 2016-10-24 MED ORDER — ONDANSETRON HCL 4 MG/2ML IJ SOLN
4.0000 mg | Freq: Four times a day (QID) | INTRAMUSCULAR | Status: DC | PRN
  Administered 2016-10-25: 4 mg via INTRAVENOUS
  Filled 2016-10-24: qty 2

## 2016-10-24 MED ORDER — OXYCODONE-ACETAMINOPHEN 5-325 MG PO TABS
1.0000 | ORAL_TABLET | ORAL | Status: DC | PRN
  Filled 2016-10-24: qty 1

## 2016-10-24 MED ORDER — DIPHENHYDRAMINE HCL 50 MG/ML IJ SOLN: 12.5000 mg | INTRAMUSCULAR | Status: DC | PRN

## 2016-10-24 NOTE — Progress Notes (Addendum)
Patient ID: Susan Zhang, female   DOB: 1986-01-19, 31 y.o.   MRN: [id number]   +FM x 2, no LOF, no VB, occ ctx; HA this AM - pt rates as 6/10 no other PIH sx's.  Will try tylenol.    AF BP 130-153/80-90's (131/90) good uop gen NAD   FHTs 120-135, mod var, accels, category 1 FHTs 120-135, mod var, accels, category 1  toco occ  Continue management Close monitoring Plan for delivery at 37 weeks or before if sx's dictate Check labs today D/w NICU will come for consult

## 2016-10-24 NOTE — Progress Notes (Signed)
Called BS CC Katie F., rn and report given. Pt transferred to BS rm 168

## 2016-10-24 NOTE — Progress Notes (Signed)
Patient ID: Susan Zhang, female   DOB: 10/14/1985, 31 y.o.   MRN: [id number]   Pt had HA this AM - 6/10 relieved with Tylenol HA recurred, now 8/10,also with R eye pain.  Took percocet and min relief. Will try to get some rest and see if HA is relieved.  Poss need for IOL given neuro sx's, d/w pt and husband.   Labs stable.  BP mildly elevated 150/90's  Continue close monitoring

## 2016-10-24 NOTE — Progress Notes (Signed)
Patient ID: Susan Zhang, female   DOB: June 25, 1986, 31 y.o.   MRN: [id number]   Elevated BP and persistent HA  D/w pt worsening PreE - will induce labor.    Transfer to L&D, start Magnesium, [place] for presentation  Expect SVD

## 2016-10-24 NOTE — Consult Note (Signed)
Neonatology Consult  Note:  At the request of the patients obstetrician Dr. Melba Coon I met with Susan Zhang and her husband.  She is 35 1 weeks currently with pregnancy complicated by di/di twins, some preterm contractions without cervical change, short cervix - at 23 week - treated with vaginal progesterone.  Had Betamethasone at 23 weeks and is completing a second course today.  She is now admitted for management of preeclampsia.  Current plan is to observe for 1-2 weeks however will move to induction should she have further symptoms.  We reviewed criteria for NICU admission (babies will not automatically qualify based on estimated fetal weight and GA), initial delivery room management, including CPAP, Aquilla, and low but certainly possible need for intubation for surfactant administration.  We discussed feeding immaturity and need for full po intake with multiple days of good weight gain and no apnea or bradycardia before discharge.  We reviewed increased risk of jaundice, infection, and temperature instability.    Discussed likely length of stay.  Thank you for allowing [place] to participate in her care.  Please call with questions.  Higinio Roger, DO  Neonatologist   The total length of face-to-face or floor / unit time for this encounter was 20 minutes.  Counseling and / or coordination of care was greater than fifty percent of the time.

## 2016-10-24 NOTE — Progress Notes (Signed)
Patient ID: Susan Zhang, female   DOB: [birth year], 31 y.o.   MRN: [id number]   Pt's labor induction in started - On Mg, pitocin D/w pt POC and ROM  Pt c/o congestion - offered saline nasal spray  AFVSS gen NAD AROM for clear fluid w/o diff/comp FHTs A 130-140, mod var, accels, category 1  B 130-140, mod var, accels, category 1  toco occ  SVE 2.3/100/0  31 yo G1P0 @ 35+ with di/di twins for IOL/PreE Continue IOL

## 2016-10-25 ENCOUNTER — Inpatient Hospital Stay (HOSPITAL_COMMUNITY): Payer: BLUE CROSS/BLUE SHIELD | Admitting: Anesthesiology

## 2016-10-25 ENCOUNTER — Encounter (HOSPITAL_COMMUNITY): Payer: Self-pay | Admitting: Obstetrics and Gynecology

## 2016-10-25 DIAGNOSIS — O30009 Twin pregnancy, unspecified number of placenta and unspecified number of amniotic sacs, unspecified trimester: Secondary | ICD-10-CM

## 2016-10-25 HISTORY — DX: Twin pregnancy, unspecified number of placenta and unspecified number of amniotic sacs, unspecified trimester: O30.009

## 2016-10-25 LAB — MAGNESIUM: MAGNESIUM: 5.2 mg/dL — AB (ref 1.7–2.4)

## 2016-10-25 LAB — CBC
HEMATOCRIT: 25.5 % — AB (ref 36.0–46.0)
Hemoglobin: 8.5 g/dL — ABNORMAL LOW (ref 12.0–15.0)
MCH: 31.6 pg (ref 26.0–34.0)
MCHC: 33.3 g/dL (ref 30.0–36.0)
MCV: 94.8 fL (ref 78.0–100.0)
Platelets: 156 10*3/uL (ref 150–400)
RBC: 2.69 MIL/uL — AB (ref 3.87–5.11)
RDW: 14.7 % (ref 11.5–15.5)
WBC: 6.1 10*3/uL (ref 4.0–10.5)

## 2016-10-25 MED ORDER — DIPHENHYDRAMINE HCL 25 MG PO CAPS
25.0000 mg | ORAL_CAPSULE | Freq: Four times a day (QID) | ORAL | Status: DC | PRN
  Administered 2016-10-25: 25 mg via ORAL
  Filled 2016-10-25: qty 1

## 2016-10-25 MED ORDER — SIMETHICONE 80 MG PO CHEW: 80.0000 mg | CHEWABLE_TABLET | ORAL | Status: DC | PRN

## 2016-10-25 MED ORDER — COCONUT OIL OIL
1.0000 "application " | TOPICAL_OIL | Status: DC | PRN
  Administered 2016-10-27: 1 via TOPICAL
  Filled 2016-10-25: qty 120

## 2016-10-25 MED ORDER — SODIUM BICARBONATE 8.4 % IV SOLN
INTRAVENOUS | Status: DC | PRN
  Administered 2016-10-25 (×2): 5 mL via EPIDURAL

## 2016-10-25 MED ORDER — IBUPROFEN 600 MG PO TABS
600.0000 mg | ORAL_TABLET | Freq: Four times a day (QID) | ORAL | Status: DC
  Administered 2016-10-25 – 2016-10-27 (×10): 600 mg via ORAL
  Filled 2016-10-25 (×10): qty 1

## 2016-10-25 MED ORDER — WITCH HAZEL-GLYCERIN EX PADS
1.0000 | MEDICATED_PAD | CUTANEOUS | Status: DC | PRN
Start: 2016-10-25 — End: 2016-10-27

## 2016-10-25 MED ORDER — ONDANSETRON HCL 4 MG/2ML IJ SOLN: 4.0000 mg | INTRAMUSCULAR | Status: DC | PRN

## 2016-10-25 MED ORDER — PRENATAL MULTIVITAMIN CH
1.0000 | ORAL_TABLET | Freq: Every day | ORAL | Status: DC
  Administered 2016-10-25 – 2016-10-27 (×3): 1 via ORAL
  Filled 2016-10-25 (×3): qty 1

## 2016-10-25 MED ORDER — ACETAMINOPHEN 325 MG PO TABS
650.0000 mg | ORAL_TABLET | ORAL | Status: DC | PRN
  Administered 2016-10-27: 650 mg via ORAL
  Filled 2016-10-25: qty 2

## 2016-10-25 MED ORDER — BENZOCAINE-MENTHOL 20-0.5 % EX AERO: 1.0000 "application " | INHALATION_SPRAY | CUTANEOUS | Status: DC | PRN

## 2016-10-25 MED ORDER — SENNOSIDES-DOCUSATE SODIUM 8.6-50 MG PO TABS
2.0000 | ORAL_TABLET | ORAL | Status: DC
  Administered 2016-10-25 – 2016-10-26 (×2): 2 via ORAL
  Filled 2016-10-25 (×2): qty 2

## 2016-10-25 MED ORDER — LACTATED RINGERS IV SOLN
INTRAVENOUS | Status: DC
  Administered 2016-10-25: 15:00:00 via INTRAVENOUS

## 2016-10-25 MED ORDER — DIBUCAINE 1 % RE OINT: 1.0000 "application " | TOPICAL_OINTMENT | RECTAL | Status: DC | PRN

## 2016-10-25 MED ORDER — ZOLPIDEM TARTRATE 5 MG PO TABS: 5.0000 mg | ORAL_TABLET | Freq: Every evening | ORAL | Status: DC | PRN

## 2016-10-25 MED ORDER — ONDANSETRON HCL 4 MG PO TABS: 4.0000 mg | ORAL_TABLET | ORAL | Status: DC | PRN

## 2016-10-25 MED ORDER — OXYCODONE HCL 5 MG PO TABS
5.0000 mg | ORAL_TABLET | ORAL | Status: DC | PRN
  Filled 2016-10-25: qty 1

## 2016-10-25 MED ORDER — OXYCODONE HCL 5 MG PO TABS
10.0000 mg | ORAL_TABLET | ORAL | Status: DC | PRN
  Administered 2016-10-25 (×2): 10 mg via ORAL
  Filled 2016-10-25: qty 2

## 2016-10-25 MED ORDER — TETANUS-DIPHTH-ACELL PERTUSSIS 5-2.5-18.5 LF-MCG/0.5 IM SUSP: 0.5000 mL | Freq: Once | INTRAMUSCULAR | Status: DC
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
## 2016-10-25 NOTE — Progress Notes (Signed)
Patient ID: Susan Zhang, female   DOB: 09/05/1985, 31 y.o.   MRN: [id number]   PPD#0  Twin SVD/PreE  Feeling much better.  Diureses 4+ L today BP mildly elevated, may need antihypertensive, will watch overnight, t/c procardia  gen NAD Lungs CTAB Abd soft, NT  Mg off, routine PP care.  Close monitoring

## 2016-10-25 NOTE — Lactation Note (Signed)
This note was copied from a baby's chart. Lactation Consultation Note  Patient Name: Girl Najia Hurlbutt ZOXWR'U Date: 10/25/2016 Reason for consult: Initial assessment;Multiple gestation;Late preterm infant;Infant < 6lbs Breastfeeding consultation services and support information given and reviewed.  Late Preterm handout also given and reviewed.  Twins are mom's first babies.  They are 35.2 weeks and both under 5 pounds.  Reviewed late preterm feeding and discussed it may take a few weeks before babies are breastfeeding effectively.  Babies are receiving supplement of 22 calorie formula per bottle.  Symphony pump set up and initiated.  Mom shown hand expression and large drop of colostrum obtained.  Instructed to pump and hand express every 3 hours.  Okay to put babies to breast with cues but limit time a breast so they won't be too fatigued for bottle.  Encouraged to call with concerns/assist prn.  Maternal Data Has patient been taught Hand Expression?: Yes Does the patient have breastfeeding experience prior to this delivery?: No  Feeding Feeding Type: Bottle Fed - Formula Nipple Type: Slow - flow (very sleepy-poor suck)  LATCH Score/Interventions Latch: Too sleepy or reluctant, no latch achieved, no sucking elicited. Intervention(s): Skin to skin  Audible Swallowing: None Intervention(s): Hand expression  Type of Nipple: Everted at rest and after stimulation  Comfort (Breast/Nipple): Soft / non-tender     Hold (Positioning): Full assist, staff holds infant at breast  LATCH Score: 4  Lactation Tools Discussed/Used Pump Review: Setup, frequency, and cleaning;Milk Storage Initiated by:: LC Date initiated:: 10/25/16   Consult Status Consult Status: Follow-up Date: 10/26/16 Follow-up type: In-patient    Huston Foley 10/25/2016, 10:16 AM

## 2016-10-25 NOTE — Anesthesia Postprocedure Evaluation (Signed)
Anesthesia Post Note  Patient: Susan Zhang  Procedure(s) Performed: * No procedures listed *  Patient location during evaluation: L&D Anesthesia Type: Epidural Level of consciousness: awake and alert, patient cooperative and oriented Pain management: pain level controlled Vital Signs Assessment: post-procedure vital signs reviewed and stable Respiratory status: spontaneous breathing, nonlabored ventilation and respiratory function stable Cardiovascular status: blood pressure returned to baseline Postop Assessment: no headache, no backache, epidural receding and no signs of nausea or vomiting Anesthetic complications: no        Last Vitals:  Vitals:   10/25/16 0333 10/25/16 0403  BP: (!) 146/77 (!) 151/82  Pulse: 79 86  Resp: 18 18  Temp:      Last Pain:  Vitals:   10/25/16 0233  TempSrc: Oral  PainSc:    Pain Goal: Patients Stated Pain Goal: 0 (10/21/16 1945)               Gwynne Edinger

## 2016-10-25 NOTE — Progress Notes (Signed)
   10/24/16 2353  Vital Signs  BP (!) 157/90  Pulse Rate 78  BP noted as above prior to administering labetalol. Held at this time due to patient requesting epidural. Dr. Ellyn Hack notified and states okay to hold. Anesthesia notified and will come to bedside.

## 2016-10-25 NOTE — Anesthesia Preprocedure Evaluation (Signed)
Anesthesia Evaluation  Patient identified by MRN, date of birth, ID band Patient awake    Reviewed: Allergy & Precautions, NPO status , Patient's Chart, lab work & pertinent test results  Airway Mallampati: II  TM Distance: >3 FB Neck ROM: Full    Dental no notable dental hx.    Pulmonary neg pulmonary ROS,    Pulmonary exam normal        Cardiovascular hypertension, Pt. on medications  Rhythm:Regular     Neuro/Psych negative neurological ROS  negative psych ROS   GI/Hepatic negative GI ROS, Neg liver ROS,   Endo/Other    Renal/GU negative Renal ROS  negative genitourinary   Musculoskeletal negative musculoskeletal ROS (+)   Abdominal (+) + obese,   Peds negative pediatric ROS (+)  Hematology  (+) anemia ,   Anesthesia Other Findings   Reproductive/Obstetrics (+) Pregnancy                             Anesthesia Physical Anesthesia Plan  ASA: III  Anesthesia Plan: Epidural   Post-op Pain Management:    Induction:   Airway Management Planned:   Additional Equipment:   Intra-op Plan:   Post-operative Plan:   Informed Consent: I have reviewed the patients History and Physical, chart, labs and discussed the procedure including the risks, benefits and alternatives for the proposed anesthesia with the patient or authorized representative who has indicated his/her understanding and acceptance.     Plan Discussed with:   Anesthesia Plan Comments:         Anesthesia Quick Evaluation

## 2016-10-26 LAB — CBC
HCT: 22.9 % — ABNORMAL LOW (ref 36.0–46.0)
HEMOGLOBIN: 7.3 g/dL — AB (ref 12.0–15.0)
MCH: 30.5 pg (ref 26.0–34.0)
MCHC: 31.9 g/dL (ref 30.0–36.0)
MCV: 95.8 fL (ref 78.0–100.0)
Platelets: 147 10*3/uL — ABNORMAL LOW (ref 150–400)
RBC: 2.39 MIL/uL — AB (ref 3.87–5.11)
RDW: 14.1 % (ref 11.5–15.5)
WBC: 5.7 10*3/uL (ref 4.0–10.5)

## 2016-10-26 MED ORDER — NIFEDIPINE ER OSMOTIC RELEASE 30 MG PO TB24
90.0000 mg | ORAL_TABLET | Freq: Every day | ORAL | Status: DC
  Administered 2016-10-27: 90 mg via ORAL
  Filled 2016-10-26: qty 3

## 2016-10-26 MED ORDER — FUROSEMIDE 10 MG/ML IJ SOLN
40.0000 mg | Freq: Once | INTRAMUSCULAR | Status: AC
  Administered 2016-10-26: 40 mg via INTRAVENOUS
  Filled 2016-10-26: qty 4

## 2016-10-26 MED ORDER — NIFEDIPINE ER OSMOTIC RELEASE 30 MG PO TB24
60.0000 mg | ORAL_TABLET | Freq: Every day | ORAL | Status: DC
  Administered 2016-10-26: 60 mg via ORAL
  Filled 2016-10-26: qty 2

## 2016-10-26 MED ORDER — FUROSEMIDE 8 MG/ML PO SOLN: 40.0000 mg | Freq: Once | ORAL | Status: DC

## 2016-10-26 NOTE — Progress Notes (Signed)
Post Partum Day 1 Subjective: Feeling better, no HA or other PIH sx.  Normal lochia. Twins at bedside Objective: Blood pressure (!) 151/87, pulse 73, temperature 98.2 F (36.8 C), temperature source Oral, resp. rate 18, height  (1.702 m), weight 136.1 kg (300 lb), SpO2 100 %, unknown if currently breastfeeding.  Physical Exam:  General: alert and cooperative Lochia: appropriate Uterine Fundus: firm  DVT Evaluation: No evidence of DVT seen on physical exam.                            3+ pitting edema  Recent Labs  10/25/16 0738 10/26/16 0541  HGB 8.5* 7.3*  HCT 25.5* 22.9*    Assessment/Plan: Will try some gentle diuresis today given extensive LE edema Begin Procardia XL  for increased BP   LOS: 5 days   ACCALIA RIGDON 10/26/2016, 9:01 AM

## 2016-10-26 NOTE — Anesthesia Postprocedure Evaluation (Signed)
Anesthesia Post Note  Patient: Susan Zhang  Procedure(s) Performed: * No procedures listed *  Patient location during evaluation: [facility] Unit Anesthesia Type: Epidural Level of consciousness: awake and alert and oriented Pain management: pain level controlled Vital Signs Assessment: post-procedure vital signs reviewed and stable Respiratory status: spontaneous breathing and nonlabored ventilation Cardiovascular status: stable Postop Assessment: no headache, patient able to bend at knees, no backache, no signs of nausea or vomiting, epidural receding and adequate PO intake Anesthetic complications: no        Last Vitals:  Vitals:   10/25/16 2304 10/26/16 0510  BP: (!) 147/88 (!) 151/87  Pulse: 70 73  Resp: 18 18  Temp: 36.9 C 36.8 C    Last Pain:  Vitals:   10/26/16 0710  TempSrc:   PainSc: Asleep   Pain Goal: Patients Stated Pain Goal: 0 (10/21/16 1945)               Laban Emperor

## 2016-10-26 NOTE — Addendum Note (Signed)
Addendum  created 10/26/16 0755 by Elgie Congo, CRNA   Sign clinical note

## 2016-10-26 NOTE — Progress Notes (Signed)
Patient ID: Susan Zhang, female   DOB: Oct 15, 1985, 31 y.o.   MRN: [id number] BP improved but still elevated, will increase procardia to  XL  Still significant LE edema but reports voiding frequently with lasix

## 2016-10-26 NOTE — Lactation Note (Signed)
This note was copied from a baby's chart. Lactation Consultation Note  Patient Name: Susan Zhang [id number] Date: 10/26/2016 Reason for consult: Follow-up assessment  Twins, LPI 31 hours old. Mom reports that she has been putting both babies to breast first with each feeding, but babies are sleepy. Mom states that she is supplementing with formula after babies at breast. Mom reports that she has pumped a couple of times, but is not "getting anything." Assisted mom with hand expression with a few dots of colostrum visible at nipple. Discussed LPI behavior and progression of milk coming to volume. Discussed supply and demand and enc mom to pump after babies nurse and are supplemented. Mom states that she wants to be able to nurse. Discussed with mom that babies will be stronger at the breast as they get closer to their original due date. Discussed the benefits of STS to breast milk supply.   Both babies have just been to breast and supplemented an hour earlier, but Baby Girl "B" cueing to nurse, so assisted mom with positioning and latch. However, Baby "B" sleepy at breast. Discussed the benefits of STS and attempting at breast for both baby and mom's milk supply. Mom wanting to continue with STS at this time. Mom to call for assistance as needed.   Maternal Data Has patient been taught Hand Expression?: Yes Does the patient have breastfeeding experience prior to this delivery?: No  Feeding Feeding Type: Breast Fed Nipple Type: Slow - flow Length of feed: 0 min  LATCH Score/Interventions Latch: Too sleepy or reluctant, no latch achieved, no sucking elicited. Intervention(s): Skin to skin;Teach feeding cues;Waking techniques Intervention(s): Adjust position;Assist with latch;Breast compression  Audible Swallowing: None Intervention(s): Skin to skin;Hand expression  Type of Nipple: Everted at rest and after stimulation  Comfort (Breast/Nipple): Soft / non-tender     Hold  (Positioning): Assistance needed to correctly position infant at breast and maintain latch. Intervention(s): Breastfeeding basics reviewed;Support Pillows;Position options;Skin to skin  LATCH Score: 5  Lactation Tools Discussed/Used Pump Review: Setup, frequency, and cleaning;Milk Storage Initiated by:: Bedside RN Date initiated:: 10/25/16   Consult Status Consult Status: Follow-up Date: 10/27/16 Follow-up type: In-patient    Sherlyn Hay 10/26/2016, 12:04 PM

## 2016-10-27 MED ORDER — NIFEDIPINE ER OSMOTIC RELEASE 90 MG PO TB24: 90.0000 mg | ORAL_TABLET | Freq: Every day | ORAL | 1 refills | Status: AC

## 2016-10-27 MED ORDER — IBUPROFEN 600 MG PO TABS: 600.0000 mg | ORAL_TABLET | Freq: Four times a day (QID) | ORAL | 1 refills | Status: DC | PRN

## 2016-10-27 MED ORDER — HYDROCHLOROTHIAZIDE 12.5 MG PO TABS: 12.5000 mg | ORAL_TABLET | Freq: Two times a day (BID) | ORAL | 1 refills | Status: AC

## 2016-10-27 NOTE — Progress Notes (Signed)
Patient ID: Susan Zhang, female   DOB: Jul 28, 1985, 31 y.o.   MRN: [id number]  Pt doing well. Feels well except for LE swelling . States it does not bother her when ambulating and has no specific tenderness ; just feels tight. She denies any CP, SOB or lightheadedness. She requests discharge to home. Tolerating procardia   VS 135/82 9 [phone]); 96%, 83, 18 ABD - soft, ND EXT - +3 pitting edema  A/P: PPD2 s/p di di twin svd; iol due to preE - stable now on procardia and hctz         Discussed discharge to home - reviewed preE precautions; pt for bp check in office in 2-3days           Rx for hctz bid and procardia 90xl daily sent

## 2016-10-27 NOTE — Lactation Note (Signed)
This note was copied from a baby's chart. Lactation Consultation Note  Patient Name: Susan Zhang [id number] Date: 10/27/2016   Twins 54 hours old. Mom reports that she is continuing to put the babies to breast as she can, but is primarily giving formula. Mom states that she did pump once this morning and is seeing some colostrum. Reviewed LPI behavior and feeding guidelines and supply and demand and offered to assist with latching, but mom reports that all is well. Gave mom a "Mother and Baby Care" booklet with review for EBM storage guidelines and number of diapers to expect by day of life. Mom given paperwork for hospital-grade DEBP rental, and mom to call for [facility] as needed. Mom requested a hand pump and it was given with review as well. Mom aware of OP/BFSG and LC phone line assistance after D/C.   Maternal Data    Feeding Feeding Type: Bottle Fed - Formula Nipple Type: Slow - flow Length of feed: 15 min  LATCH Score/Interventions                      Lactation Tools Discussed/Used     Consult Status      Sherlyn Hay 10/27/2016, 11:44 AM

## 2016-10-27 NOTE — Discharge Instructions (Signed)
Nothing in vagina for 6 weeks.  No sex, tampons, and douching.  Other instructions as in [facility] Discharge Booklet. °

## 2016-10-27 NOTE — Discharge Summary (Signed)
OB Discharge Summary     Patient Name: Susan Zhang DOB: 02-Jun-1986 MRN: [id number]  Date of admission: 10/20/2016 Delivering MD:    Su Hilt, Girl Virginie [[id number]]  Liliann, File [[id number]]  Sherian Rein   Date of discharge: 10/27/2016  Admitting diagnosis: 34WKS HBP Intrauterine pregnancy: [redacted]w[redacted]d     Secondary diagnosis:  Principal Problem:   Twin pregnancy delivered vaginally Active Problems:   PIH (pregnancy induced hypertension), third trimester   SVD (spontaneous vaginal delivery)  Additional problems: lower extremity edema     Discharge diagnosis: Term Pregnancy Delivered and Preeclampsia (mild)      ; twins                                                                                          Post partum procedures:none  Augmentation: AROM and Pitocin  Complications: None  Hospital course:  Induction of Labor With Vaginal Delivery   31 y.o. yo [id number] at [redacted]w[redacted]d was admitted to the hospital 10/20/2016 for induction of labor.  Indication for induction: Preeclampsia.  Patient had an uncomplicated labor course as follows: Membrane Rupture Time/Date:    Pedro, [place] [[id number]]  10:18 PM   Chaunda, Vandergriff [[id number]]  4:53 AM ,   Su Hilt, Girl Ettel [[id number]]  10/24/2016   Geetika, Laborde [[id number]]  10/25/2016   Intrapartum Procedures: Episiotomy:    Brandyn, Thien [[id number]]  None [1]   Mckensey, Berghuis [[id number]]  None [1]                                         Lacerations:     Karleigh, Bunte [[id number]]  None [1]   Storie, Heffern [[id number]]  None [1]  Patient had delivery of a Viable infants.  Information for the patient's newborn:  Adelheid, Hoggard [[id number]]  Delivery Method: Vag-Spont Information for the patient's newborn:  Madyn, Ivins [[id number]]  Delivery Method: Vertell Novak, Girl Nitika [[id number]]   10/25/2016   Kyliah, Deanda [[id number]]  10/25/2016  Details of delivery can be found in separate delivery note.  Patient had a routine postpartum course. Patient is discharged home 10/27/16.  Physical exam  Vitals:   10/26/16 2345 10/27/16 0419 10/27/16 0801 10/27/16 1201  BP: (!) 144/79 139/84 (!) 141/87 135/82  Pulse: 92 83 83 83  Resp: Temp: 97.8 F (36.6 C) 98.6 F (37 C) 98.4 F (36.9 C) 98.6 F (37 C)  TempSrc: Oral Oral Oral Oral  SpO2: 100% 100% 100% 96%  Weight:      Height:       General: alert, cooperative and no distress Lochia: appropriate Uterine Fundus: firm Incision: N/A DVT Evaluation: Calf/Ankle edema is present Labs: Lab Results  Component Value Date   WBC 5.7 10/26/2016   HGB 7.3 (L) 10/26/2016   HCT 22.9 (L) 10/26/2016   MCV 95.8 10/26/2016   PLT 147 (L) 10/26/2016   CMP Latest Ref Rng & Units 10/24/2016  Glucose 65 - 99 mg/dL 960(A)  BUN 6 - 20 mg/dL 7  Creatinine 5.40 - 9.81 mg/dL 1.91  Sodium 478 - 295 mmol/L 137  Potassium 3.5 - 5.1 mmol/L 3.9  Chloride 101 - 111 mmol/L 109  CO2 22 - 32 mmol/L 21(L)  Calcium 8.9 - 10.3 mg/dL 9.4  Total Protein 6.5 - 8.1 g/dL 6.4(L)  Total Bilirubin 0.3 - 1.2 mg/dL 0.5  Alkaline Phos 38 - 126 U/L 146(H)  AST 15 - 41 U/L 22  ALT 14 - 54 U/L 8(L)    Discharge instruction: per After Visit Summary and "Baby and Me Booklet".  After visit meds:  Allergies as of 10/27/2016      Reactions   Shrimp [shellfish Allergy] Other (See Comments)   Throat swells     Catfish [fish Allergy] Rash   Latex Rash      Medication List    STOP taking these medications   ondansetron 8 MG disintegrating tablet Commonly known as:  ZOFRAN ODT   progesterone 200 MG Supp   promethazine 25 MG tablet Commonly known as:  PHENERGAN     TAKE these medications   acetaminophen 500 MG chewable tablet Commonly known as:  TYLENOL Chew 1,000 mg by mouth every 6 (six) hours as needed for pain.    hydrochlorothiazide 12.5 MG tablet Commonly known as:  HYDRODIURIL Take 1 tablet (12.5 mg total) by mouth 2 (two) times daily.   ibuprofen 600 MG tablet Commonly known as:  ADVIL,MOTRIN Take 1 tablet (600 mg total) by mouth every 6 (six) hours as needed.   NIFEdipine 90 MG 24 hr tablet Commonly known as:  PROCARDIA XL/ADALAT-CC Take 1 tablet (90 mg total) by mouth daily. Start taking on:  10/28/2016   PRENATAL GUMMIES/DHA & FA PO Take 1 each by mouth daily.       Diet: low salt diet  Activity: Advance as tolerated. Pelvic rest for 6 weeks.   Outpatient follow up:2-3 days and 6 weeks Follow up Appt:No future appointments. Follow up Visit:No Follow-up on file.  Postpartum contraception: Undecided  Newborn Data:   Brittnei, Jagiello [[id number]]  Live born female  Birth Weight: 4 lb 6.7 oz (2005 g) APGAR: 8, 9   Milliana, Reddoch [[id number]]  Live born female  Birth Weight: 4 lb 11 oz (2126 g) APGAR: 8, 8  Babies Feeding: Bottle and Breast Disposition:home with mother   10/27/2016 Cathrine Muster, DO

## 2016-10-28 ENCOUNTER — Ambulatory Visit: Payer: Self-pay

## 2016-10-28 NOTE — Lactation Note (Signed)
This note was copied from a baby's chart. Lactation Consultation Note  Patient Name: Susan Zhang WCHEN'I Date: 10/28/2016   Twins 10 hours old. Mom is giving formula by bottle, but reports that she is still attempting to latch babies before each feeding. Mom declines assistance with latching and reports that her personal pump has been delivered. Reviewed the benefits of hospital-grade DEBP, and mom aware that she can rent after D/C. Mom reports that she is seeing a few ml of EBM from right breast, but isn't pumping routinely. Mom asked if there is anything else she can do to improve EBM supply. Discussed primary importance of latching babies and pumping. Mom enc to take her pumping kit with her when she leaves. Mom aware of OP/BFSG and [place] phone line assistance after D/C.   Maternal Data    Feeding    LATCH Score/Interventions                      Lactation Tools Discussed/Used     Consult Status      Andres Labrum 10/28/2016, 9:55 AM

## 2017-06-09 ENCOUNTER — Ambulatory Visit: Payer: BLUE CROSS/BLUE SHIELD | Attending: Obstetrics and Gynecology | Admitting: Physical Therapy

## 2018-04-23 IMAGING — CT CT ANGIO NECK
1 of 8 series · 5 of 33 positions shown · IV contrast ([ID] [id number] [id number])
Comparison: Cervical spine radiograph 04/28/2012

CLINICAL DATA: Right-sided neck pain for 1 week.

EXAM:
CT ANGIOGRAPHY NECK
TECHNIQUE: Multidetector CT imaging of the neck was performed using the
standard protocol during bolus administration of intravenous
contrast. Multiplanar CT image reconstructions and MIPs were
obtained to evaluate the vascular anatomy. Carotid stenosis
measurements (when applicable) are obtained utilizing NASCET
criteria, using the distal internal carotid diameter as the
denominator.
CONTRAST:  Insert 100 mL Isovue 370

[Series 401: axial thin · axial · 0.51mm/px · z∈[-57,+112]mm · 5 of 255 slices shown]
[im 43/255  soft-tissue]
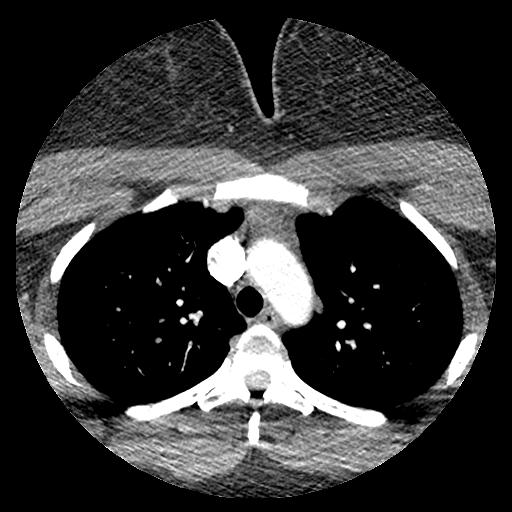
[im 85/255  bone]
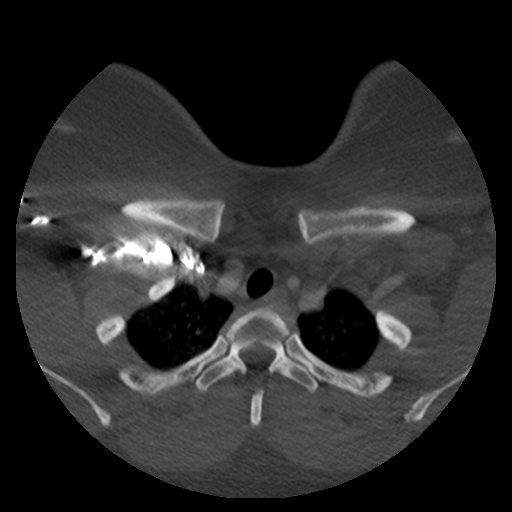
[im 128/255  soft-tissue]
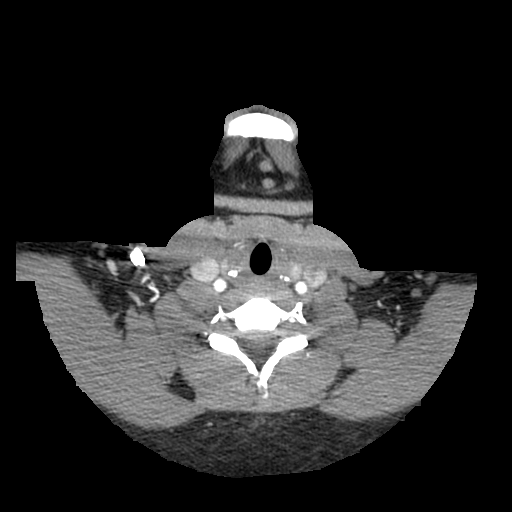
[im 170/255  bone]
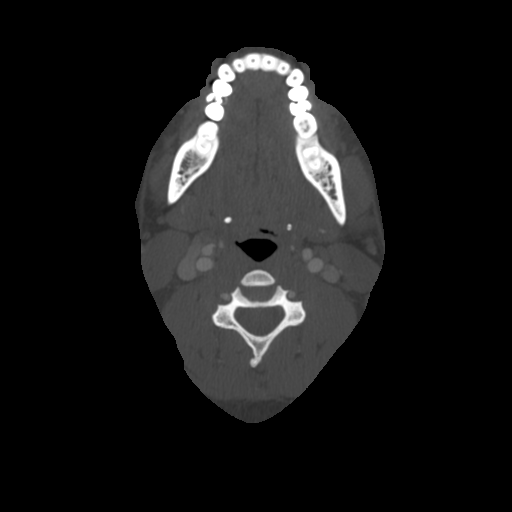
[im 212/255  soft-tissue]
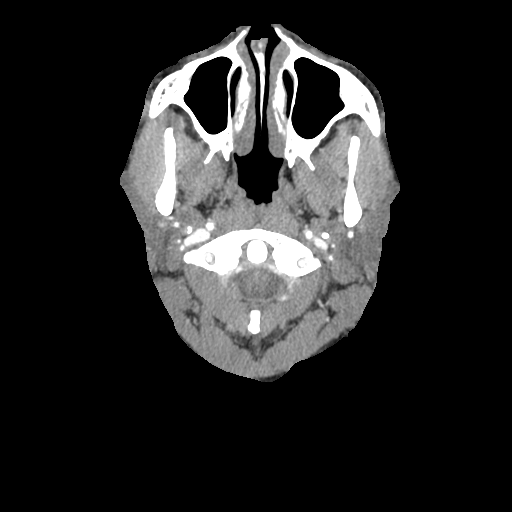

[5 of 33 positions shown; findings below may reference images not displayed]

FINDINGS: Aortic arch: Normal three-vessel branch pattern. Visualized proximal
subclavian arteries are normal.

Right carotid system: Normal

Left carotid system: Normal

Vertebral arteries:Normal

Skeleton: There is reversal of the normal cervical lordosis. No
lytic or blastic osseous lesions.

Soft tissues:

-- visualized intracranial:  Normal

-- visualized airways all sinuses:  Normal

--visualized orbits: Normal

-- pharynx and larynx: Mild prominence of the adenoid tonsils. The
nasopharynx is clear. The oropharynx and hypopharynx are normal. The
epiglottis is normal. The supraglottic larynx, glottis and
subglottic larynx are normal.

--salivary glands: The parotid and submandibular glands are normal.
No sialolithiasis or salivary ductal dilatation.

--thyroid:  Normal.

-- lymph nodes: Multiple scattered subcentimeter cervical lymph
nodes but no adenopathy by CT size criteria. No necrotic or
morphologically abnormal lymph nodes.

-- other soft tissues: There is bilateral partial ossification of
the stylohyoid ligaments, right greater than left. This is best
demonstrated on the coronal images (series 603 images 103- 113).

Upper chest: Normal
IMPRESSION: 1. Normal appearance of the cervical arterial vasculature.
2. Bilateral ossification of the stylohyoid ligaments, right greater
than left, which is seen in the setting of Dzsulio syndrome.
3. No cervical adenopathy or soft tissue mass.
4. Reversal of the normal cervical lordosis without other findings
of significant degenerative disc disease.

## 2020-06-05 ENCOUNTER — Institutional Professional Consult (permissible substitution): Payer: Self-pay | Admitting: Plastic Surgery

## 2021-02-09 ENCOUNTER — Emergency Department (HOSPITAL_COMMUNITY)
Admission: EM | Admit: 2021-02-09 | Discharge: 2021-02-09 | Disposition: A | Discharge status: Home / Self Care | Payer: BLUE CROSS/BLUE SHIELD | Attending: Emergency Medicine | Admitting: Emergency Medicine

## 2021-02-09 ENCOUNTER — Emergency Department (HOSPITAL_COMMUNITY): Payer: BLUE CROSS/BLUE SHIELD

## 2021-02-09 ENCOUNTER — Encounter (HOSPITAL_COMMUNITY): Payer: Self-pay | Admitting: Emergency Medicine

## 2021-02-09 ENCOUNTER — Other Ambulatory Visit: Payer: Self-pay

## 2021-02-09 DIAGNOSIS — K91872 Postprocedural seroma of a digestive system organ or structure following a digestive system procedure: Secondary | ICD-10-CM | POA: Insufficient documentation

## 2021-02-09 DIAGNOSIS — K529 Noninfective gastroenteritis and colitis, unspecified: Secondary | ICD-10-CM | POA: Insufficient documentation

## 2021-02-09 DIAGNOSIS — S301XXA Contusion of abdominal wall, initial encounter: Secondary | ICD-10-CM

## 2021-02-09 DIAGNOSIS — Z91040 Latex allergy status: Secondary | ICD-10-CM | POA: Insufficient documentation

## 2021-02-09 DIAGNOSIS — Z20822 Contact with and (suspected) exposure to covid-19: Secondary | ICD-10-CM | POA: Insufficient documentation

## 2021-02-09 DIAGNOSIS — N9489 Other specified conditions associated with female genital organs and menstrual cycle: Secondary | ICD-10-CM | POA: Insufficient documentation

## 2021-02-09 DIAGNOSIS — D649 Anemia, unspecified: Secondary | ICD-10-CM | POA: Insufficient documentation

## 2021-02-09 DIAGNOSIS — R2243 Localized swelling, mass and lump, lower limb, bilateral: Secondary | ICD-10-CM | POA: Insufficient documentation

## 2021-02-09 DIAGNOSIS — Z79899 Other long term (current) drug therapy: Secondary | ICD-10-CM | POA: Insufficient documentation

## 2021-02-09 LAB — URINALYSIS, ROUTINE W REFLEX MICROSCOPIC
Bacteria, UA: NONE SEEN
Bilirubin Urine: NEGATIVE
Glucose, UA: NEGATIVE mg/dL
Hgb urine dipstick: NEGATIVE
Ketones, ur: 5 mg/dL — AB
Leukocytes,Ua: NEGATIVE
Nitrite: NEGATIVE
Protein, ur: 100 mg/dL — AB
Specific Gravity, Urine: 1.034 — ABNORMAL HIGH (ref 1.005–1.030)
pH: 5 (ref 5.0–8.0)

## 2021-02-09 LAB — CBC WITH DIFFERENTIAL/PLATELET
Abs Immature Granulocytes: 0.01 10*3/uL (ref 0.00–0.07)
Basophils Absolute: 0 10*3/uL (ref 0.0–0.1)
Basophils Relative: 1 %
Eosinophils Absolute: 0.1 10*3/uL (ref 0.0–0.5)
Eosinophils Relative: 2 %
HCT: 35.5 % — ABNORMAL LOW (ref 36.0–46.0)
Hemoglobin: 11 g/dL — ABNORMAL LOW (ref 12.0–15.0)
Immature Granulocytes: 0 %
Lymphocytes Relative: 42 %
Lymphs Abs: 1.8 10*3/uL (ref 0.7–4.0)
MCH: 31.1 pg (ref 26.0–34.0)
MCHC: 31 g/dL (ref 30.0–36.0)
MCV: 100.3 fL — ABNORMAL HIGH (ref 80.0–100.0)
Monocytes Absolute: 0.4 10*3/uL (ref 0.1–1.0)
Monocytes Relative: 9 %
Neutro Abs: 1.9 10*3/uL (ref 1.7–7.7)
Neutrophils Relative %: 46 %
Platelets: 246 10*3/uL (ref 150–400)
RBC: 3.54 MIL/uL — ABNORMAL LOW (ref 3.87–5.11)
RDW: 12 % (ref 11.5–15.5)
WBC: 4.2 10*3/uL (ref 4.0–10.5)
nRBC: 0 % (ref 0.0–0.2)

## 2021-02-09 LAB — I-STAT BETA HCG BLOOD, ED (MC, WL, AP ONLY): I-stat hCG, quantitative: <5 m[IU]/mL (ref <5)

## 2021-02-09 LAB — COMPREHENSIVE METABOLIC PANEL
ALT: 11 U/L (ref 0–44)
AST: 18 U/L (ref 15–41)
Albumin: 3.5 g/dL (ref 3.5–5.0)
Alkaline Phosphatase: 86 U/L (ref 38–126)
Anion gap: 6 (ref 5–15)
BUN: 11 mg/dL (ref 6–20)
CO2: 25 mmol/L (ref 22–32)
Calcium: 8.8 mg/dL — ABNORMAL LOW (ref 8.9–10.3)
Chloride: 106 mmol/L (ref 98–111)
Creatinine, Ser: 0.77 mg/dL (ref 0.44–1.00)
GFR, Estimated: >60 mL/min (ref >60)
Glucose, Bld: 76 mg/dL (ref 70–99)
Potassium: 4.1 mmol/L (ref 3.5–5.1)
Sodium: 137 mmol/L (ref 135–145)
Total Bilirubin: 0.9 mg/dL (ref 0.3–1.2)
Total Protein: 6.9 g/dL (ref 6.5–8.1)

## 2021-02-09 LAB — LIPASE, BLOOD: Lipase: 36 U/L (ref 11–51)

## 2021-02-09 LAB — BRAIN NATRIURETIC PEPTIDE: B Natriuretic Peptide: 28.6 pg/mL (ref 0.0–100.0)

## 2021-02-09 MED ORDER — ONDANSETRON HCL 4 MG/2ML IJ SOLN
4.0000 mg | Freq: Once | INTRAMUSCULAR | Status: AC
  Administered 2021-02-09: 4 mg via INTRAVENOUS
  Filled 2021-02-09: qty 2

## 2021-02-09 MED ORDER — IOHEXOL 300 MG/ML  SOLN
100.0000 mL | Freq: Once | INTRAMUSCULAR | Status: AC | PRN
  Administered 2021-02-09: 100 mL via INTRAVENOUS

## 2021-02-09 MED ORDER — ONDANSETRON 4 MG PO TBDP: 4.0000 mg | ORAL_TABLET | Freq: Three times a day (TID) | ORAL | 0 refills | Status: DC | PRN

## 2021-02-09 MED ORDER — SODIUM CHLORIDE 0.9 % IV BOLUS
1000.0000 mL | Freq: Once | INTRAVENOUS | Status: AC
  Administered 2021-02-09: 1000 mL via INTRAVENOUS

## 2021-02-09 NOTE — ED Notes (Signed)
Patient verbalizes understanding of discharge instructions. Prescriptions reviewed. Opportunity for questioning and answers were provided. Armband removed by staff, pt discharged from ED ambulatory. ° °

## 2021-02-09 NOTE — ED Triage Notes (Signed)
Pt reports dizziness, vomiting, shaking, abd distention, SOB, and feet swollen x 2-3 days. Denies chest pain.

## 2021-02-09 NOTE — ED Provider Notes (Signed)
[facility] EMERGENCY DEPARTMENT Provider Note  CSN: [id number] Arrival date & time: 02/09/21 1737    History Chief Complaint  Patient presents with   Abdominal Pain    Ilham Roughton is a 35 y.o. female with history of prior 'tummy tuck' reports in the last 2-3 days she has had dizziness, nausea, vomiting and feeling like her abdomen is bloated. She has had fevers and decreased urine output. A home Covid test was negative. She also reports some puffiness in her feet, she attributes the abdominal bloating and feet swelling to retaining fluid. She does not have any significant medical problems other than iron. Does not take any prescription meds except for iron supplements.    Past Medical History:  Diagnosis Date   Anemia    No pertinent past medical history    PIH (pregnancy induced hypertension), third trimester 10/20/2016   SVD (spontaneous vaginal delivery) 10/25/2016   Twin pregnancy delivered vaginally 10/25/2016    Past Surgical History:  Procedure Laterality Date   NO PAST SURGERIES      Family History  Problem Relation Age of Onset   Anesthesia problems Neg Hx     Social History   Tobacco Use   Smoking status: Never   Smokeless tobacco: Never  Substance Use Topics   Alcohol use: No   Drug use: No     Home Medications Prior to Admission medications   Medication Sig Start Date End Date Taking? Authorizing Provider  ondansetron (ZOFRAN ODT) 4 MG disintegrating tablet Take 1 tablet (4 mg total) by mouth every 8 (eight) hours as needed for nausea or vomiting. 02/09/21  Yes Pollyann Savoy, MD  acetaminophen (TYLENOL) 500 MG chewable tablet Chew 1,000 mg by mouth every 6 (six) hours as needed for pain.    [provider]  hydrochlorothiazide (HYDRODIURIL) 12.5 MG tablet Take 1 tablet (12.5 mg total) by mouth 2 (two) times daily. 10/27/16   Banga, Sharol Given, DO  ibuprofen (ADVIL,MOTRIN) 600 MG tablet Take 1 tablet (600 mg total) by mouth every 6  (six) hours as needed. 10/27/16   Pryor Ochoa Worema, DO  NIFEdipine (PROCARDIA XL/ADALAT-CC) 90 MG 24 hr tablet Take 1 tablet (90 mg total) by mouth daily. 10/28/16   Banga, Sharol Given, DO  Prenatal MV-Min-FA-Omega-3 (PRENATAL GUMMIES/DHA & FA PO) Take 1 each by mouth daily.    [provider]     Allergies    Shrimp [shellfish allergy], Catfish [fish allergy], and Latex   Review of Systems   Review of Systems A comprehensive review of systems was completed and negative except as noted in HPI.    Physical Exam BP 104/63   Pulse 73   Temp 98.9 F (37.2 C) (Oral)   Resp 15   LMP 01/15/2021   SpO2 100%   Physical Exam Vitals and nursing note reviewed.  Constitutional:      Appearance: Normal appearance.  HENT:     Head: Normocephalic and atraumatic.     Nose: Nose normal.     Mouth/Throat:     Mouth: Mucous membranes are moist.  Eyes:     Extraocular Movements: Extraocular movements intact.     Conjunctiva/sclera: Conjunctivae normal.  Cardiovascular:     Rate and Rhythm: Normal rate.  Pulmonary:     Effort: Pulmonary effort is normal.     Breath sounds: Normal breath sounds.  Abdominal:     General: Abdomen is flat. There is no distension.     Palpations: Abdomen is  soft.     Tenderness: There is abdominal tenderness (diffuse). There is no guarding.  Musculoskeletal:        General: No swelling. Normal range of motion.     Cervical back: Neck supple.     Comments: Trace edema of ankles bilateral and symmetric.   Skin:    General: Skin is warm and dry.  Neurological:     General: No focal deficit present.     Mental Status: She is alert.  Psychiatric:        Mood and Affect: Mood normal.     ED Results / Procedures / Treatments   Labs (all labs ordered are listed, but only abnormal results are displayed) Labs Reviewed  CBC WITH DIFFERENTIAL/PLATELET - Abnormal; Notable for the following components:      Result Value   RBC 3.54 (*)     Hemoglobin 11.0 (*)    HCT 35.5 (*)    MCV 100.3 (*)    All other components within normal limits  COMPREHENSIVE METABOLIC PANEL - Abnormal; Notable for the following components:   Calcium 8.8 (*)    All other components within normal limits  URINALYSIS, ROUTINE W REFLEX MICROSCOPIC - Abnormal; Notable for the following components:   Color, Urine AMBER (*)    APPearance HAZY (*)    Specific Gravity, Urine 1.034 (*)    Ketones, ur 5 (*)    Protein, ur 100 (*)    All other components within normal limits  SARS CORONAVIRUS 2 (TAT 6-24 HRS)  BRAIN NATRIURETIC PEPTIDE  LIPASE, BLOOD  I-STAT BETA HCG BLOOD, ED ([facility], [facility], [facility] ONLY)  CBG MONITORING, ED    EKG None  Radiology CT Abdomen Pelvis W Contrast  Result Date: 02/09/2021 CLINICAL DATA:  Bowel obstruction suspected Vomiting and abdominal distension EXAM: CT ABDOMEN AND PELVIS WITH CONTRAST TECHNIQUE: Multidetector CT imaging of the abdomen and pelvis was performed using the standard protocol following bolus administration of intravenous contrast. CONTRAST:  OMNIPAQUE IOHEXOL 300 MG/ML  SOLN COMPARISON:  CT 11/30/2015 FINDINGS: Lower chest: No acute airspace disease or pleural effusion. Heart is normal in size. Trace pericardial effusion. Hepatobiliary: Punctate calcified granuloma in the liver. No suspicious liver abnormality. Clips in the gallbladder fossa postcholecystectomy. No biliary dilatation. Pancreas: No ductal dilatation or inflammation. Spleen: Normal in size without focal abnormality. Adrenals/Urinary Tract: Normal adrenal glands. No hydronephrosis. No perinephric edema. There is early excretion of IV contrast within both renal collecting systems. No focal renal abnormality. Nondistended urinary bladder. Stomach/Bowel: Gastric sleeve resection. Stomach is decompressed. Occasional fluid-filled distal small bowel without abnormal distension or obstruction. No small bowel inflammation. Appendix tentatively but not definitively  identified and normal. Regardless, there is no appendicitis. Moderate stool burden throughout the colon. Vascular/Lymphatic: Normal caliber abdominal aorta. Patent portal vein. No abdominopelvic adenopathy. Reproductive: Anteverted mildly heterogeneous uterus. Ovaries are difficult to define. No adnexal mass. Other: Liposuction changes of the posterior abdominal soft tissues. There is a bilobed fluid collection in the subcutaneous tissues of the abdominal midline at the level of the umbilicus measuring approximately 13.4 x 3.0 x 8.5 cm. There is some peripheral enhancement but no internal air. No significant surrounding fat stranding or inflammation. Patchy soft tissue changes seen in the subcutaneous tissues about the gluteal regions, and extending lateral to the right hip. There is no soft tissue air. Musculoskeletal: There are no acute or suspicious osseous abnormalities. IMPRESSION: 1. No bowel obstruction. Moderate stool burden throughout the colon, can be seen with constipation. 2.  Bilobed fluid collection in the subcutaneous tissues of the abdominal midline at the level of the umbilicus measuring approximately 13.4 x 3.0 x 8.5 cm. There is some peripheral enhancement but no significant surrounding fat stranding or inflammation. This may represent a postoperative seroma or hematoma, sterility is indeterminate by imaging. 3. Subcutaneous changes consistent with liposuction, asymmetric in the right hip. No findings to suggest superimposed infection. Electronically Signed   By: Narda Rutherford M.D.   On: 02/09/2021 21:23    Procedures Procedures  Medications Ordered in the ED Medications  sodium chloride 0.9 % bolus 1,000 mL (0 mLs Intravenous Stopped 02/09/21 2038)  ondansetron (ZOFRAN) injection 4 mg (4 mg Intravenous Given 02/09/21 1950)  iohexol (OMNIPAQUE) 300 MG/ML solution 100 mL (100 mLs Intravenous Contrast Given 02/09/21 2055)     MDM Rules/Calculators/A&P MDM Patient with vomiting and  abdominal pain. Exam is relatively benign. Labs ordered in triage pending. Will give IVF, antiemetics and send for CT to rule out obstruction.   ED Course  I have reviewed the triage vital signs and the nursing notes.  Pertinent labs & imaging results that were available during my care of the patient were reviewed by me and considered in my medical decision making (see chart for details).  Clinical Course as of 02/09/21 2311  Wynelle Link Feb 09, 2021  1935 CBC with mild anemia. UA is concentrated without signs of infection.  [CS]  2010 CMP and Lipase are normal.  [CS]  2306 Patient feeling better, discussed lab and imaging results with the patient. Plan discharge home, recommend oral hydration, close PCP follow up. Covid is pending, recommend quarantine until results have come back.  [CS]    Clinical Course User Index [CS] Pollyann Savoy, MD    Final Clinical Impression(s) / ED Diagnoses Final diagnoses:  Gastroenteritis  Abdominal wall seroma, initial encounter    Rx / DC Orders ED Discharge Orders          Ordered    ondansetron (ZOFRAN ODT) 4 MG disintegrating tablet  Every 8 hours PRN        02/09/21 2311             Pollyann Savoy, MD 02/09/21 2311

## 2021-02-09 NOTE — ED Provider Notes (Signed)
Emergency Medicine Provider Triage Evaluation Note  Susan Zhang , a 35 y.o. female  was evaluated in triage.  Pt complains of nausea, vomiting, fever, lightheadedness, swelling.  Symptoms began 2 to 3 days ago.  Began with nausea and vomiting and abdominal distention.  Now having swelling in her abdomen and feet.  Dark urination.  Fever 103 last night. No chest pain.  Review of Systems  Positive: N/v, abd pain/swelling, fever Negative: cp  Physical Exam  BP 123/71 (BP Location: Right Arm)   Pulse 81   Temp 98.9 F (37.2 C) (Oral)   Resp 14   LMP 01/15/2021   SpO2 100%  Gen:   Awake, no distress   Resp:  Normal effort  MSK:   Moves extremities without difficulty  Other:  Distention of abd, no ttp.   Medical Decision Making  Medically screening exam initiated at 6:13 PM.  Appropriate orders placed.  Noreta Kue was informed that the remainder of the evaluation will be completed by another provider, this initial triage assessment does not replace that evaluation, and the importance of remaining in the ED until their evaluation is complete.  Labs and ekg   Alveria Apley, PA-C 02/09/21 1814    Terald Sleeper, MD 02/10/21 [phone]

## 2021-02-10 LAB — SARS CORONAVIRUS 2 (TAT 6-24 HRS): SARS Coronavirus 2: NEGATIVE

## 2021-05-22 ENCOUNTER — Emergency Department (HOSPITAL_COMMUNITY)
Admission: EM | Admit: 2021-05-22 | Discharge: 2021-05-23 | Disposition: A | Discharge status: Home / Self Care | Payer: BLUE CROSS/BLUE SHIELD | Attending: Emergency Medicine | Admitting: Emergency Medicine

## 2021-05-22 ENCOUNTER — Other Ambulatory Visit: Payer: Self-pay

## 2021-05-22 ENCOUNTER — Encounter (HOSPITAL_COMMUNITY): Payer: Self-pay | Admitting: Emergency Medicine

## 2021-05-22 ENCOUNTER — Emergency Department (HOSPITAL_COMMUNITY): Payer: BLUE CROSS/BLUE SHIELD

## 2021-05-22 DIAGNOSIS — R0602 Shortness of breath: Secondary | ICD-10-CM | POA: Diagnosis not present

## 2021-05-22 DIAGNOSIS — Z91040 Latex allergy status: Secondary | ICD-10-CM | POA: Insufficient documentation

## 2021-05-22 DIAGNOSIS — R091 Pleurisy: Secondary | ICD-10-CM | POA: Diagnosis not present

## 2021-05-22 DIAGNOSIS — R079 Chest pain, unspecified: Secondary | ICD-10-CM | POA: Diagnosis present

## 2021-05-22 LAB — TROPONIN I (HIGH SENSITIVITY)
Troponin I (High Sensitivity): <2 ng/L (ref <18)
Troponin I (High Sensitivity): <2 ng/L (ref <18)

## 2021-05-22 LAB — BASIC METABOLIC PANEL
Anion gap: 8 (ref 5–15)
BUN: 10 mg/dL (ref 6–20)
CO2: 25 mmol/L (ref 22–32)
Calcium: 9.1 mg/dL (ref 8.9–10.3)
Chloride: 103 mmol/L (ref 98–111)
Creatinine, Ser: 0.73 mg/dL (ref 0.44–1.00)
GFR, Estimated: >60 mL/min (ref >60)
Glucose, Bld: 112 mg/dL — ABNORMAL HIGH (ref 70–99)
Potassium: 3.6 mmol/L (ref 3.5–5.1)
Sodium: 136 mmol/L (ref 135–145)

## 2021-05-22 LAB — CBC
HCT: 36.1 % (ref 36.0–46.0)
Hemoglobin: 11.1 g/dL — ABNORMAL LOW (ref 12.0–15.0)
MCH: 30.2 pg (ref 26.0–34.0)
MCHC: 30.7 g/dL (ref 30.0–36.0)
MCV: 98.4 fL (ref 80.0–100.0)
Platelets: 246 10*3/uL (ref 150–400)
RBC: 3.67 MIL/uL — ABNORMAL LOW (ref 3.87–5.11)
RDW: 12 % (ref 11.5–15.5)
WBC: 4.4 10*3/uL (ref 4.0–10.5)
nRBC: 0 % (ref 0.0–0.2)

## 2021-05-22 LAB — I-STAT BETA HCG BLOOD, ED (MC, WL, AP ONLY): I-stat hCG, quantitative: <5 m[IU]/mL (ref <5)

## 2021-05-22 MED ORDER — ALBUTEROL SULFATE HFA 108 (90 BASE) MCG/ACT IN AERS
2.0000 | INHALATION_SPRAY | Freq: Once | RESPIRATORY_TRACT | Status: AC
  Administered 2021-05-23: 2 via RESPIRATORY_TRACT
  Filled 2021-05-22: qty 6.7

## 2021-05-22 MED ORDER — KETOROLAC TROMETHAMINE 60 MG/2ML IM SOLN
30.0000 mg | Freq: Once | INTRAMUSCULAR | Status: AC
  Administered 2021-05-23: 30 mg via INTRAMUSCULAR
  Filled 2021-05-22: qty 2

## 2021-05-22 MED ORDER — DEXAMETHASONE 4 MG PO TABS
10.0000 mg | ORAL_TABLET | Freq: Once | ORAL | Status: AC
  Administered 2021-05-23: 10 mg via ORAL
  Filled 2021-05-22: qty 3

## 2021-05-22 NOTE — ED Triage Notes (Addendum)
Pt here for cp that's been intermittent over last 2 days, pt states today it is much worse started around 1400, c/o mid sternal stabbing cp, radiates to back. Pt c/o shob and feeling lightheaded, denies N/V.

## 2021-05-22 NOTE — ED Provider Notes (Signed)
[facility] EMERGENCY DEPARTMENT Provider Note   CSN: [id number] Arrival date & time: 05/22/21  1612     History Chief Complaint  Patient presents with   Chest Pain   Shortness of Breath    Susan Zhang is a 35 y.o. female.  35 year old female without significant past medical history presents emerged from today secondary to chest pain.  Patient states she had right-sided, substernal, left-sided chest pain worse with breathing worse with coughing for last 3 to 4 days.  Does not change with exertion.  She has no associated nausea vomiting, shortness of breath, lightheadedness or other associated symptoms. Endorses fever of 101.    Chest Pain Associated symptoms: shortness of breath   Shortness of Breath Associated symptoms: chest pain       Past Medical History:  Diagnosis Date   Anemia    No pertinent past medical history    PIH (pregnancy induced hypertension), third trimester 10/20/2016   SVD (spontaneous vaginal delivery) 10/25/2016   Twin pregnancy delivered vaginally 10/25/2016    Patient Active Problem List   Diagnosis Date Noted   SVD (spontaneous vaginal delivery) 10/25/2016   Twin pregnancy delivered vaginally 10/25/2016   PIH (pregnancy induced hypertension), third trimester 10/20/2016    Past Surgical History:  Procedure Laterality Date   NO PAST SURGERIES       OB History     Gravida  1   Para  1   Term  0   Preterm  1   AB  0   Living  2      SAB  0   IAB  0   Ectopic  0   Multiple  1   Live Births  2           Family History  Problem Relation Age of Onset   Anesthesia problems Neg Hx     Social History   Tobacco Use   Smoking status: Never   Smokeless tobacco: Never  Substance Use Topics   Alcohol use: No   Drug use: No    Home Medications Prior to Admission medications   Medication Sig Start Date End Date Taking? Authorizing Provider  meloxicam (MOBIC) 7.5 MG tablet Take 1 tablet (7.5 mg  total) by mouth daily for 10 days. 05/23/21 06/02/21 Yes Jaton Eilers, Barbara Cower, MD  acetaminophen (TYLENOL) 500 MG chewable tablet Chew 1,000 mg by mouth every 6 (six) hours as needed for pain.    [provider]  hydrochlorothiazide (HYDRODIURIL) 12.5 MG tablet Take 1 tablet (12.5 mg total) by mouth 2 (two) times daily. 10/27/16   Banga, Sharol Given, DO  ibuprofen (ADVIL,MOTRIN) 600 MG tablet Take 1 tablet (600 mg total) by mouth every 6 (six) hours as needed. 10/27/16   Pryor Ochoa Worema, DO  NIFEdipine (PROCARDIA XL/ADALAT-CC) 90 MG 24 hr tablet Take 1 tablet (90 mg total) by mouth daily. 10/28/16   Pryor Ochoa Worema, DO  ondansetron (ZOFRAN ODT) 4 MG disintegrating tablet Take 1 tablet (4 mg total) by mouth every 8 (eight) hours as needed for nausea or vomiting. 02/09/21   Pollyann Savoy, MD  Prenatal MV-Min-FA-Omega-3 (PRENATAL GUMMIES/DHA & FA PO) Take 1 each by mouth daily.    [provider]    Allergies    Shrimp [shellfish allergy], Catfish [fish allergy], and Latex  Review of Systems   Review of Systems  Respiratory:  Positive for shortness of breath.   Cardiovascular:  Positive for chest pain.  All other systems  reviewed and are negative.  Physical Exam Updated Vital Signs BP 112/63   Pulse (!) 40   Temp 98.4 F (36.9 C) (Oral)   Resp 17   LMP 04/29/2021 (Approximate)   SpO2 100%   Physical Exam Vitals and nursing note reviewed.  Constitutional:      Appearance: She is well-developed.  HENT:     Head: Normocephalic and atraumatic.  Cardiovascular:     Rate and Rhythm: Normal rate and regular rhythm.  Pulmonary:     Effort: No tachypnea or respiratory distress.     Breath sounds: No stridor. Wheezing (mild, bilateral upper lobes) present.  Chest:     Chest wall: No mass, deformity or tenderness.  Abdominal:     General: There is no distension.     Palpations: Abdomen is soft.  Musculoskeletal:        General: Normal range of motion.      Cervical back: Normal range of motion.  Skin:    General: Skin is warm and dry.  Neurological:     Mental Status: She is alert.    ED Results / Procedures / Treatments   Labs (all labs ordered are listed, but only abnormal results are displayed) Labs Reviewed  BASIC METABOLIC PANEL - Abnormal; Notable for the following components:      Result Value   Glucose, Bld 112 (*)    All other components within normal limits  CBC - Abnormal; Notable for the following components:   RBC 3.67 (*)    Hemoglobin 11.1 (*)    All other components within normal limits  I-STAT BETA HCG BLOOD, ED ([facility], [facility], [facility] ONLY)  TROPONIN I (HIGH SENSITIVITY)  TROPONIN I (HIGH SENSITIVITY)    EKG EKG Interpretation  Date/Time:  Thursday May 22 2021 16:29:32 EST Ventricular Rate:  76 PR Interval:  156 QRS Duration: 70 QT Interval:  362 QTC Calculation: 407 R Axis:   1 Text Interpretation: Normal sinus rhythm with sinus arrhythmia Low voltage QRS Cannot rule out Anterior infarct , age undetermined Abnormal ECG since last tracing no significant change Confirmed by Mancel Bale [phone]) on 05/22/2021 5:11:43 PM  Radiology DG Chest 2 View  Result Date: 05/22/2021 CLINICAL DATA:  Chest pain short of breath 2 days EXAM: CHEST - 2 VIEW COMPARISON:  11/30/2015 FINDINGS: Cardiac and mediastinal contours normal. Lungs clear without infiltrate or effusion. 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 Surgical clips in the region the stomach. IMPRESSION: No active cardiopulmonary disease. Electronically Signed   By: Marlan Palau M.D.   On: 05/22/2021 17:19    Procedures Procedures   Medications Ordered in ED Medications  albuterol (VENTOLIN HFA) 108 (90 Base) MCG/ACT inhaler 2 puff (2 puffs Inhalation Given 05/23/21 0012)  dexamethasone (DECADRON) tablet 10 mg (10 mg Oral Given 05/23/21 0012)  ketorolac (TORADOL) injection 30 mg (30 mg Intramuscular Given 05/23/21 0012)    ED Course   I have reviewed the triage vital signs and the nursing notes.  Pertinent labs & imaging results that were available during my care of the patient were reviewed by me and considered in my medical decision making (see chart for details).    MDM Rules/Calculators/A&P                         Patient seems to have pleurisy.  Possibly a viral illness however its unclear.  Discussed further work-up or treatment patient states she is ready  go home.  We will go and treat her symptomatically and have her follow-up with PCP urgent care if not improving.  Otherwise will return here for worsening symptoms.   Final Clinical Impression(s) / ED Diagnoses Final diagnoses:  Chest pain, unspecified type  Pleurisy    Rx / DC Orders ED Discharge Orders          Ordered    meloxicam (MOBIC) 7.5 MG tablet  Daily        05/23/21 0023             Chanler Mendonca, Barbara Cower, MD 05/23/21 0031

## 2021-05-22 NOTE — ED Provider Notes (Signed)
Emergency Medicine Provider Triage Evaluation Note  Susan Zhang , a 35 y.o. female  was evaluated in triage.  Pt complains of chest pain.  Its been intermittent for last 2 days, became worse today around 2 PM.  Pain is substernal, radiates straight to her back and feels a sharp stabbing pain.  Feels it more when she breathes or when she leans forward.  No history 3 of pulmonary embolism, MI.  Patient does not smoke, no hypertension history..  Review of Systems  Positive: Chest pain, back pain Negative: Nausea, vomiting  Physical Exam  BP 134/75 (BP Location: Right Arm)   Pulse 80   Temp 98.8 F (37.1 C)   Resp 20   LMP 04/29/2021 (Approximate)   SpO2 100%  Gen:   Awake, no distress   Resp:  Normal effort  MSK:   Moves extremities without difficulty  Other:  S1-S2 without any murmurs, rubs, gallops  Medical Decision Making  Medically screening exam initiated at 4:34 PM.  Appropriate orders placed.  Susan Zhang was informed that the remainder of the evaluation will be completed by another provider, this initial triage assessment does not replace that evaluation, and the importance of remaining in the ED until their evaluation is complete.  PERC negative, low suspicion for dissection given stable vitals, age, lack of risk factors.  We will start with chest pain work-ups.   Susan Arista, PA-C 05/22/21 1635    Mancel Bale, MD 05/22/21 [phone]

## 2021-05-23 MED ORDER — MELOXICAM 7.5 MG PO TABS: 7.5000 mg | ORAL_TABLET | Freq: Every day | ORAL | 0 refills | Status: AC

## 2021-06-10 ENCOUNTER — Encounter (HOSPITAL_BASED_OUTPATIENT_CLINIC_OR_DEPARTMENT_OTHER): Payer: Self-pay | Admitting: *Deleted

## 2021-06-10 ENCOUNTER — Emergency Department (HOSPITAL_BASED_OUTPATIENT_CLINIC_OR_DEPARTMENT_OTHER)
Admission: EM | Admit: 2021-06-10 | Discharge: 2021-06-10 | Disposition: A | Discharge status: Home / Self Care | Payer: BLUE CROSS/BLUE SHIELD | Attending: Emergency Medicine | Admitting: Emergency Medicine

## 2021-06-10 ENCOUNTER — Other Ambulatory Visit: Payer: Self-pay

## 2021-06-10 DIAGNOSIS — Z20822 Contact with and (suspected) exposure to covid-19: Secondary | ICD-10-CM | POA: Diagnosis not present

## 2021-06-10 DIAGNOSIS — J069 Acute upper respiratory infection, unspecified: Secondary | ICD-10-CM | POA: Insufficient documentation

## 2021-06-10 DIAGNOSIS — Z79899 Other long term (current) drug therapy: Secondary | ICD-10-CM | POA: Diagnosis not present

## 2021-06-10 DIAGNOSIS — Z91040 Latex allergy status: Secondary | ICD-10-CM | POA: Diagnosis not present

## 2021-06-10 DIAGNOSIS — Z28310 Unvaccinated for covid-19: Secondary | ICD-10-CM | POA: Diagnosis not present

## 2021-06-10 DIAGNOSIS — J101 Influenza due to other identified influenza virus with other respiratory manifestations: Secondary | ICD-10-CM | POA: Diagnosis not present

## 2021-06-10 DIAGNOSIS — R509 Fever, unspecified: Secondary | ICD-10-CM | POA: Diagnosis present

## 2021-06-10 DIAGNOSIS — J111 Influenza due to unidentified influenza virus with other respiratory manifestations: Secondary | ICD-10-CM

## 2021-06-10 LAB — RESP PANEL BY RT-PCR (FLU A&B, COVID) ARPGX2
Influenza A by PCR: POSITIVE — AB
Influenza B by PCR: NEGATIVE
SARS Coronavirus 2 by RT PCR: NEGATIVE

## 2021-06-10 MED ORDER — XOFLUZA (40 MG DOSE) 1 X 40 MG PO TBPK
40.0000 mg | ORAL_TABLET | Freq: Once | ORAL | 0 refills | Status: AC
Start: 2021-06-10 — End: 2021-06-10

## 2021-06-10 MED ORDER — XOFLUZA (40 MG DOSE) 1 X 40 MG PO TBPK: 40.0000 mg | ORAL_TABLET | Freq: Once | ORAL | 0 refills | Status: DC

## 2021-06-10 NOTE — ED Triage Notes (Signed)
Headache, fever, body aches, diarrhea and dizziness for a week.

## 2021-06-10 NOTE — Discharge Instructions (Signed)
As we discussed you have the flu.  I encourage plenty of fluids, rest, Tylenol, ibuprofen as needed for body aches, fever.  I am prescribing you Susan Zhang which is a one-time medication that may shorten your course by a day or so considering your symptoms just began.  I recommend that you remain out of work until you are fever free for 24 hours without having to use Tylenol or ibuprofen.

## 2021-06-10 NOTE — ED Provider Notes (Signed)
[facility] EMERGENCY DEPARTMENT Provider Note   CSN: [id number] Arrival date & time: 06/10/21  1256     History Chief Complaint  Patient presents with   URI    Susan Zhang is a 35 y.o. female with no significant past medical history who presents with 1 day of headache, fever, body aches for the last day.  Patient reports she has not taken anything for this, has not measured a objective fever at home, but has felt somewhat hot.  Patient has been around her grandmother who has been feeling sick for the last week.  Patient denies chest pain, shortness of breath, wheezing.  Patient denies sore throat, cough, congestion.  Patient has not had COVID, flu vaccine this year.   URI Presenting symptoms: congestion   Associated symptoms: myalgias       Past Medical History:  Diagnosis Date   Anemia    No pertinent past medical history    PIH (pregnancy induced hypertension), third trimester 10/20/2016   SVD (spontaneous vaginal delivery) 10/25/2016   Twin pregnancy delivered vaginally 10/25/2016    Patient Active Problem List   Diagnosis Date Noted   SVD (spontaneous vaginal delivery) 10/25/2016   Twin pregnancy delivered vaginally 10/25/2016   PIH (pregnancy induced hypertension), third trimester 10/20/2016    Past Surgical History:  Procedure Laterality Date   NO PAST SURGERIES       OB History     Gravida  1   Para  1   Term  0   Preterm  1   AB  0   Living  2      SAB  0   IAB  0   Ectopic  0   Multiple  1   Live Births  2           Family History  Problem Relation Age of Onset   Anesthesia problems Neg Hx     Social History   Tobacco Use   Smoking status: Never   Smokeless tobacco: Never  Substance Use Topics   Alcohol use: No   Drug use: No    Home Medications Prior to Admission medications   Medication Sig Start Date End Date Taking? Authorizing Provider  Baloxavir Marboxil,40 MG Dose, (XOFLUZA, 40 MG DOSE,) 1 x 40  MG TBPK Take 40 mg by mouth once for 1 dose. 06/10/21 06/10/21 Yes Jocelyne Reinertsen H, PA-C  acetaminophen (TYLENOL) 500 MG chewable tablet Chew 1,000 mg by mouth every 6 (six) hours as needed for pain.    [provider]  hydrochlorothiazide (HYDRODIURIL) 12.5 MG tablet Take 1 tablet (12.5 mg total) by mouth 2 (two) times daily. 10/27/16   Banga, Sharol Given, DO  ibuprofen (ADVIL,MOTRIN) 600 MG tablet Take 1 tablet (600 mg total) by mouth every 6 (six) hours as needed. 10/27/16   Pryor Ochoa Worema, DO  NIFEdipine (PROCARDIA XL/ADALAT-CC) 90 MG 24 hr tablet Take 1 tablet (90 mg total) by mouth daily. 10/28/16   Pryor Ochoa Worema, DO  ondansetron (ZOFRAN ODT) 4 MG disintegrating tablet Take 1 tablet (4 mg total) by mouth every 8 (eight) hours as needed for nausea or vomiting. 02/09/21   Pollyann Savoy, MD  Prenatal MV-Min-FA-Omega-3 (PRENATAL GUMMIES/DHA & FA PO) Take 1 each by mouth daily.    [provider]    Allergies    Shrimp [shellfish allergy], Catfish [fish allergy], and Latex  Review of Systems   Review of Systems  Constitutional:  Positive for chills.  HENT:  Positive for congestion.   Musculoskeletal:  Positive for myalgias.  All other systems reviewed and are negative.  Physical Exam Updated Vital Signs BP 113/78 (BP Location: Right Arm)   Pulse 96   Temp 98.1 F (36.7 C) (Oral)   Resp 16   Ht 5\' 7"  (1.702 m)   Wt 89.4 kg   LMP 05/27/2021   SpO2 99%   BMI 30.85 kg/m   Physical Exam Vitals and nursing note reviewed.  Constitutional:      General: She is not in acute distress.    Appearance: Normal appearance.  HENT:     Head: Normocephalic and atraumatic.  Eyes:     General:        Right eye: No discharge.        Left eye: No discharge.  Cardiovascular:     Rate and Rhythm: Normal rate and regular rhythm.  Pulmonary:     Effort: Pulmonary effort is normal. No respiratory distress.  Musculoskeletal:        General: No  deformity.  Skin:    General: Skin is warm and dry.  Neurological:     Mental Status: She is alert and oriented to person, place, and time.  Psychiatric:        Mood and Affect: Mood normal.        Behavior: Behavior normal.    ED Results / Procedures / Treatments   Labs (all labs ordered are listed, but only abnormal results are displayed) Labs Reviewed  RESP PANEL BY RT-PCR (FLU A&B, COVID) ARPGX2 - Abnormal; Notable for the following components:      Result Value   Influenza A by PCR POSITIVE (*)    All other components within normal limits    EKG None  Radiology No results found.  Procedures Procedures   Medications Ordered in ED Medications - No data to display  ED Course  I have reviewed the triage vital signs and the nursing notes.  Pertinent labs & imaging results that were available during my care of the patient were reviewed by me and considered in my medical decision making (see chart for details).    MDM Rules/Calculators/A&P                         Overall well-appearing patient with upper respiratory symptoms.  Minimal to no congestion noted.  Oropharynx clear.  No respiratory distress, normal heart sounds.  Likely contracted from grandmother who is present in the room and also positive for flu.  Given patient is only been experiencing symptoms for 1 day will prescribe Xofluza.  Patient understands and agrees to plan, urged rest, fluids, ibuprofen, Tylenol as needed for body aches, fever.  Discharged in stable condition at this time. Final Clinical Impression(s) / ED Diagnoses Final diagnoses:  Flu    Rx / DC Orders ED Discharge Orders          Ordered    Baloxavir Marboxil,40 MG Dose, (XOFLUZA, 40 MG DOSE,) 1 x 40 MG TBPK   Once        06/10/21 1740             Ruthy Forry, [place] H, PA-C 06/10/21 1741    06/12/21, MD 06/11/21 1226

## 2023-08-19 ENCOUNTER — Emergency Department (HOSPITAL_COMMUNITY): Payer: Self-pay

## 2023-08-19 ENCOUNTER — Other Ambulatory Visit: Payer: Self-pay

## 2023-08-19 ENCOUNTER — Encounter (HOSPITAL_COMMUNITY): Payer: Self-pay

## 2023-08-19 ENCOUNTER — Emergency Department (HOSPITAL_COMMUNITY)
Admission: EM | Admit: 2023-08-19 | Discharge: 2023-08-19 | Discharge status: Against Medical Advice | Payer: Self-pay | Attending: Emergency Medicine | Admitting: Emergency Medicine

## 2023-08-19 DIAGNOSIS — Z5321 Procedure and treatment not carried out due to patient leaving prior to being seen by health care provider: Secondary | ICD-10-CM | POA: Insufficient documentation

## 2023-08-19 DIAGNOSIS — R040 Epistaxis: Secondary | ICD-10-CM | POA: Insufficient documentation

## 2023-08-19 DIAGNOSIS — R519 Headache, unspecified: Secondary | ICD-10-CM | POA: Diagnosis not present

## 2023-08-19 DIAGNOSIS — R55 Syncope and collapse: Secondary | ICD-10-CM | POA: Insufficient documentation

## 2023-08-19 DIAGNOSIS — M542 Cervicalgia: Secondary | ICD-10-CM | POA: Diagnosis not present

## 2023-08-19 LAB — CBC WITH DIFFERENTIAL/PLATELET
Abs Immature Granulocytes: 0 10*3/uL (ref 0.00–0.07)
Basophils Absolute: 0 10*3/uL (ref 0.0–0.1)
Basophils Relative: 1 %
Eosinophils Absolute: 0.1 10*3/uL (ref 0.0–0.5)
Eosinophils Relative: 1 %
HCT: 36.7 % (ref 36.0–46.0)
Hemoglobin: 11.9 g/dL — ABNORMAL LOW (ref 12.0–15.0)
Immature Granulocytes: 0 %
Lymphocytes Relative: 59 %
Lymphs Abs: 2.9 10*3/uL (ref 0.7–4.0)
MCH: 31.3 pg (ref 26.0–34.0)
MCHC: 32.4 g/dL (ref 30.0–36.0)
MCV: 96.6 fL (ref 80.0–100.0)
Monocytes Absolute: 0.3 10*3/uL (ref 0.1–1.0)
Monocytes Relative: 6 %
Neutro Abs: 1.6 10*3/uL — ABNORMAL LOW (ref 1.7–7.7)
Neutrophils Relative %: 33 %
Platelets: 293 10*3/uL (ref 150–400)
RBC: 3.8 MIL/uL — ABNORMAL LOW (ref 3.87–5.11)
RDW: 11.9 % (ref 11.5–15.5)
WBC: 4.8 10*3/uL (ref 4.0–10.5)
nRBC: 0 % (ref 0.0–0.2)

## 2023-08-19 LAB — COMPREHENSIVE METABOLIC PANEL
ALT: 11 U/L (ref 0–44)
AST: 19 U/L (ref 15–41)
Albumin: 3.9 g/dL (ref 3.5–5.0)
Alkaline Phosphatase: 81 U/L (ref 38–126)
Anion gap: 7 (ref 5–15)
BUN: 8 mg/dL (ref 6–20)
CO2: 24 mmol/L (ref 22–32)
Calcium: 9.1 mg/dL (ref 8.9–10.3)
Chloride: 105 mmol/L (ref 98–111)
Creatinine, Ser: 0.78 mg/dL (ref 0.44–1.00)
GFR, Estimated: >60 mL/min (ref >60)
Glucose, Bld: 91 mg/dL (ref 70–99)
Potassium: 3.6 mmol/L (ref 3.5–5.1)
Sodium: 136 mmol/L (ref 135–145)
Total Bilirubin: 0.8 mg/dL (ref 0.0–1.2)
Total Protein: 7.6 g/dL (ref 6.5–8.1)

## 2023-08-19 LAB — HCG, SERUM, QUALITATIVE: Preg, Serum: NEGATIVE

## 2023-08-19 MED ORDER — IOHEXOL 350 MG/ML SOLN
75.0000 mL | Freq: Once | INTRAVENOUS | Status: AC | PRN
  Administered 2023-08-19: 75 mL via INTRAVENOUS

## 2023-08-19 MED ORDER — ACETAMINOPHEN 500 MG PO TABS
1000.0000 mg | ORAL_TABLET | Freq: Once | ORAL | Status: AC
  Administered 2023-08-19: 1000 mg via ORAL
  Filled 2023-08-19: qty 2

## 2023-08-19 NOTE — ED Notes (Signed)
 PT wanted to leave and left.
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
## 2023-11-08 ENCOUNTER — Emergency Department
Admission: EM | Admit: 2023-11-08 | Discharge: 2023-11-08 | Disposition: A | Discharge status: Home / Self Care | Payer: Self-pay | Attending: Emergency Medicine | Admitting: Emergency Medicine

## 2023-11-08 ENCOUNTER — Other Ambulatory Visit: Payer: Self-pay

## 2023-11-08 DIAGNOSIS — X58XXXA Exposure to other specified factors, initial encounter: Secondary | ICD-10-CM | POA: Insufficient documentation

## 2023-11-08 DIAGNOSIS — S39012A Strain of muscle, fascia and tendon of lower back, initial encounter: Secondary | ICD-10-CM | POA: Insufficient documentation

## 2023-11-08 MED ORDER — CYCLOBENZAPRINE HCL 5 MG PO TABS
5.0000 mg | ORAL_TABLET | Freq: Three times a day (TID) | ORAL | 0 refills | Status: AC | PRN
Start: 2023-11-08 — End: ?

## 2023-11-08 MED ORDER — PREDNISONE 50 MG PO TABS: 50.0000 mg | ORAL_TABLET | Freq: Every day | ORAL | 0 refills | Status: AC

## 2023-11-08 MED ORDER — DEXAMETHASONE SODIUM PHOSPHATE 10 MG/ML IJ SOLN
10.0000 mg | Freq: Once | INTRAMUSCULAR | Status: AC
  Administered 2023-11-08: 10 mg via INTRAMUSCULAR
  Filled 2023-11-08: qty 1

## 2023-11-08 MED ORDER — CYCLOBENZAPRINE HCL 10 MG PO TABS
5.0000 mg | ORAL_TABLET | Freq: Once | ORAL | Status: AC
  Administered 2023-11-08: 5 mg via ORAL
  Filled 2023-11-08: qty 1

## 2023-11-08 MED ORDER — KETOROLAC TROMETHAMINE 30 MG/ML IJ SOLN
30.0000 mg | Freq: Once | INTRAMUSCULAR | Status: AC
  Administered 2023-11-08: 30 mg via INTRAMUSCULAR
  Filled 2023-11-08: qty 1

## 2023-11-08 NOTE — Discharge Instructions (Addendum)
 You were evaluated in the ED for lower back pain.  Sickle exam findings are reassuring of any life-threatening injury or illnesses.  Please follow-up with your primary care provider for further evaluation as needed.  Get plenty of rest and alternate applying a heating pad and ice to the affected area.

## 2023-11-08 NOTE — ED Notes (Signed)
 See triage note  Presents with pain to left lower back  moving into left leg  Denies any fall or injury  But did say increased pain with ambulation

## 2023-11-08 NOTE — ED Triage Notes (Signed)
 Pt to Ed via POV from home. Pt reports left lower back pain with radiation to left leg that started this morning upon waking. Pt reports pain 9/10 sitting. No loss of bowel or bladder.

## 2023-11-08 NOTE — ED Provider Notes (Signed)
 [facility] Emergency Department Provider Note     Event Date/Time   First MD Initiated Contact with Patient 11/08/23 1459     (approximate)   History   Back Pain   HPI  Susan Zhang is a 38 y.o. female with no significant past medical history presents to the ED for evaluation of low back pain localized to the right side with radiation descending down her left leg.  Describes pain as sharp.  Worse with movement.  No known injury or trauma.  Patient has never experienced this before.  Denies fever, urinary symptoms, loss of bowel and bladder control, leg numbness and saddle anesthesia.     Physical Exam   Triage Vital Signs: ED Triage Vitals  Encounter Vitals Group     BP 11/08/23 1346 109/68     Systolic BP Percentile --      Diastolic BP Percentile --      Pulse Rate 11/08/23 1346 74     Resp 11/08/23 1346 20     Temp 11/08/23 1346 98.1 F (36.7 C)     Temp src --      SpO2 11/08/23 1346 100 %     Weight 11/08/23 1438 218 lb 14.7 oz (99.3 kg)     Height 11/08/23 1438 5\' 6"  (1.676 m)     Head Circumference --      Peak Flow --      Pain Score 11/08/23 1345 9     Pain Loc --      Pain Education --      Exclude from Growth Chart --     Most recent vital signs: Vitals:   11/08/23 1346  BP: 109/68  Pulse: 74  Resp: 20  Temp: 98.1 F (36.7 C)  SpO2: 100%   General: Discomfort.  Alert and oriented. INAD.  Skin:  Warm, dry and intact. No rashes or lesions noted.     Head:  NCAT.  Eyes:  PERRLA. EOMI.  CV:  Good peripheral perfusion.   RESP:  Normal effort.  BACK:  Spinous process is midline without deformity or tenderness.  Negative CVA tenderness. (+) Left SLRT @ 30 degrees. MSK:   Full ROM in all joints. No swelling, deformity or tenderness.  NEURO: Cranial nerves intact. No focal deficits. Sensation and motor function intact.   ED Results / Procedures / Treatments   Labs (all labs ordered are listed, but only abnormal  results are displayed) Labs Reviewed - No data to display  No results found.  PROCEDURES:  Critical Care performed: No  Procedures  MEDICATIONS ORDERED IN ED: Medications  dexamethasone  (DECADRON ) injection 10 mg (has no administration in time range)  ketorolac  (TORADOL ) 30 MG/ML injection 30 mg (30 mg Intramuscular Given 11/08/23 1536)  cyclobenzaprine (FLEXERIL) tablet 5 mg (5 mg Oral Given 11/08/23 1536)   IMPRESSION / MDM / ASSESSMENT AND PLAN / ED COURSE  I reviewed the triage vital signs and the nursing notes.                               38 y.o. female presents to the emergency department for evaluation and treatment of sudden low back pain without injury. See HPI for further details.   Differential diagnosis includes, but is not limited to muscle strain, sciatica, cauda equina considered but less likely  Patient's presentation is most consistent with acute complicated illness / injury requiring diagnostic workup.  Patient is alert and oriented.  She is hemodynamically stable.  Physical exam findings are stated above and overall benign.  No red flag signs indicating imaging at this time.  ED pain management with IM Toradol  and Flexeril.  Will reassess.   ----------------------------------------- 4:32 PM on 11/08/2023 -----------------------------------------  Mild Improvement of symptoms will add Decadron  IM with short course of steroids outpatient.  Patient is stable condition for discharge home.  She is encouraged to follow-up with her PCP for further management as needed.  ED return precautions discussed.  FINAL CLINICAL IMPRESSION(S) / ED DIAGNOSES   Final diagnoses:  Strain of lumbar region, initial encounter    Rx / DC Orders   ED Discharge Orders          Ordered    cyclobenzaprine (FLEXERIL) 5 MG tablet  3 times daily PRN        11/08/23 1629    predniSONE (DELTASONE) 50 MG tablet  Daily with breakfast        11/08/23 1630            Note:  This  document was prepared using Dragon voice recognition software and may include unintentional dictation errors.    Phyllis Breeze, Juliona Vales A, PA-C 11/08/23 1633    Arline Bennett, MD 11/08/23 [phone]

## 2024-06-05 ENCOUNTER — Encounter: Payer: Self-pay | Admitting: Internal Medicine
